# Patient Record
Sex: Female | Born: 1975 | Race: White | Hispanic: No | Marital: Married | Smoking: Never smoker
Health system: Southern US, Community
[De-identification: ages and names within clinical notes are randomized; demographics above are authoritative.]

## PROBLEM LIST (undated history)

## (undated) ENCOUNTER — Inpatient Hospital Stay (HOSPITAL_COMMUNITY): Payer: Self-pay

## (undated) DIAGNOSIS — Z789 Other specified health status: Secondary | ICD-10-CM

## (undated) HISTORY — PX: WISDOM TOOTH EXTRACTION: SHX21

## (undated) HISTORY — PX: MYRINGOPLASTY: SUR873

---

## 1998-07-02 ENCOUNTER — Other Ambulatory Visit: Admission: RE | Admit: 1998-07-02 | Discharge: 1998-07-02 | Payer: Self-pay | Admitting: *Deleted

## 1999-07-02 ENCOUNTER — Other Ambulatory Visit: Admission: RE | Admit: 1999-07-02 | Discharge: 1999-07-02 | Payer: Self-pay | Admitting: Obstetrics and Gynecology

## 2003-12-20 ENCOUNTER — Other Ambulatory Visit: Admission: RE | Admit: 2003-12-20 | Discharge: 2003-12-20 | Payer: Self-pay | Admitting: Obstetrics and Gynecology

## 2005-02-02 ENCOUNTER — Other Ambulatory Visit: Admission: RE | Admit: 2005-02-02 | Discharge: 2005-02-02 | Payer: Self-pay | Admitting: Obstetrics and Gynecology

## 2006-03-02 ENCOUNTER — Other Ambulatory Visit: Admission: RE | Admit: 2006-03-02 | Discharge: 2006-03-02 | Payer: Self-pay | Admitting: Obstetrics & Gynecology

## 2007-03-31 ENCOUNTER — Other Ambulatory Visit: Admission: RE | Admit: 2007-03-31 | Discharge: 2007-03-31 | Payer: Self-pay | Admitting: Obstetrics & Gynecology

## 2014-05-02 ENCOUNTER — Other Ambulatory Visit (HOSPITAL_COMMUNITY): Payer: Self-pay | Admitting: Obstetrics and Gynecology

## 2014-05-02 DIAGNOSIS — Z3141 Encounter for fertility testing: Secondary | ICD-10-CM

## 2014-05-09 ENCOUNTER — Ambulatory Visit (HOSPITAL_COMMUNITY)
Admission: RE | Admit: 2014-05-09 | Discharge: 2014-05-09 | Disposition: A | Discharge status: Home / Self Care | Payer: BC Managed Care – PPO | Source: Ambulatory Visit | Attending: Obstetrics and Gynecology | Admitting: Obstetrics and Gynecology

## 2014-05-09 DIAGNOSIS — Z3141 Encounter for fertility testing: Secondary | ICD-10-CM | POA: Insufficient documentation

## 2014-05-09 MED ORDER — IOHEXOL 300 MG/ML  SOLN
20.0000 mL | Freq: Once | INTRAMUSCULAR | Status: AC | PRN
  Administered 2014-05-09: 20 mL

## 2014-08-30 LAB — OB RESULTS CONSOLE HEPATITIS B SURFACE ANTIGEN: Hepatitis B Surface Ag: NEGATIVE

## 2014-08-30 LAB — OB RESULTS CONSOLE RUBELLA ANTIBODY, IGM: RUBELLA: IMMUNE

## 2014-08-30 LAB — OB RESULTS CONSOLE HIV ANTIBODY (ROUTINE TESTING): HIV: NONREACTIVE

## 2014-08-30 LAB — OB RESULTS CONSOLE GC/CHLAMYDIA
Chlamydia: NEGATIVE
Gonorrhea: NEGATIVE

## 2014-08-30 LAB — OB RESULTS CONSOLE RPR: RPR: NONREACTIVE

## 2015-01-14 ENCOUNTER — Encounter (HOSPITAL_COMMUNITY): Payer: Self-pay

## 2015-01-14 ENCOUNTER — Inpatient Hospital Stay (HOSPITAL_COMMUNITY)
Admission: AD | Admit: 2015-01-14 | Discharge: 2015-01-14 | Disposition: A | Discharge status: Home / Self Care | Payer: BLUE CROSS/BLUE SHIELD | Source: Ambulatory Visit | Attending: Obstetrics and Gynecology | Admitting: Obstetrics and Gynecology

## 2015-01-14 DIAGNOSIS — Z3A28 28 weeks gestation of pregnancy: Secondary | ICD-10-CM | POA: Insufficient documentation

## 2015-01-14 DIAGNOSIS — O30003 Twin pregnancy, unspecified number of placenta and unspecified number of amniotic sacs, third trimester: Secondary | ICD-10-CM | POA: Insufficient documentation

## 2015-01-14 DIAGNOSIS — O9989 Other specified diseases and conditions complicating pregnancy, childbirth and the puerperium: Secondary | ICD-10-CM | POA: Diagnosis present

## 2015-01-14 DIAGNOSIS — O26873 Cervical shortening, third trimester: Secondary | ICD-10-CM | POA: Insufficient documentation

## 2015-01-14 HISTORY — DX: Other specified health status: Z78.9

## 2015-01-14 NOTE — Discharge Instructions (Signed)
Pelvic Rest Pelvic rest is sometimes recommended for women when:   The placenta is partially or completely covering the opening of the cervix (placenta previa).  There is bleeding between the uterine wall and the amniotic sac in the first trimester (subchorionic hemorrhage).  The cervix begins to open without labor starting (incompetent cervix, cervical insufficiency).  The labor is too early (preterm labor). HOME CARE INSTRUCTIONS  Do not have sexual intercourse, stimulation, or an orgasm.  Do not use tampons, douche, or put anything in the vagina.  Do not lift anything over 10 pounds (4.5 kg).  Avoid strenuous activity or straining your pelvic muscles. SEEK MEDICAL CARE IF:  You have any vaginal bleeding during pregnancy. Treat this as a potential emergency.  You have cramping pain felt low in the stomach (stronger than menstrual cramps).  You notice vaginal discharge (watery, mucus, or bloody).  You have a low, dull backache.  There are regular contractions or uterine tightening. SEEK IMMEDIATE MEDICAL CARE IF: You have vaginal bleeding and have placenta previa.  Document Released: 03/27/2011 Document Revised: 02/22/2012 Document Reviewed: 03/27/2011 [facility] Patient Information 2015 [place], [place]. This information is not intended to replace advice given to you by your health care provider. Make sure you discuss any questions you have with your health care provider. Preterm Birth Preterm birth is a birth that happens before 37 weeks of pregnancy. Most pregnancies last about 39-41 weeks. Every week in the womb is important and is beneficial to the health of the infant. Infants born before 37 weeks of pregnancy are at a higher risk for complications. Depending on when the infant was born, he or she may be:  Late preterm. Born between 32 weeks and 37 weeks of pregnancy.  Very preterm. Born at less than 32 weeks of pregnancy.  Extremely preterm. Born at less than 25 weeks  of pregnancy. The earlier a baby is born, the more likely the child will have issues related to prematurity. Complications and problems that can be seen in infants born too early include:  Problems breathing (respiratory distress syndrome).  Low birth weight.  Problems feeding.  Sleeping problems.  Yellowing of the skin (jaundice).  Infections such as pneumonia. Babies born very preterm or extremely preterm are at risk for more serious medical issues. These include:  More severe breathing issues.  Eyesight issues.  Brain development issues (intraventricular hemorrhage).  Behavioral and emotional development issues.  Growth and developmental delays.  Cerebral palsy.  Serious feeding or bowel complications (necrotizing enterocolitis). CAUSES  There are two broad categories of preterm birth.  Spontaneous preterm birth. This is a birth resulting from preterm labor (not medically induced) or preterm premature rupture of membranes (PPROM).  Indicated preterm birth. This is a birth resulting from labor being medically induced due to health, personal, or social reasons. RISK FACTORS Preterm birth may be related to certain medical conditions, lifestyle factors, or demographic factors encountered by the mother or fetus.  Medical conditions include:  Multiple gestations (twins, triplets, and so on).  Infection.  Diabetes.  Heart disease.  Kidney disease.  Cervical or uterine abnormalities.  Being underweight.  High blood pressure or preeclampsia.  Premature rupture of membranes (PROM).  Birth defects in the fetus.  Lifestyle factors include:  Poor prenatal care.  Poor nutrition or anemia.  Cigarette smoking.  Consuming alcohol.  High levels of stress and lack of social or emotional support.  Exposure to chemical or environmental toxins.  Substance abuse.  Demographic factors include:  African-American  ethnicity.  Age (younger than 8618 or older than  37 years of age).  Low socioeconomic status. Women with a history of preterm labor or who become pregnant within 8018 months of giving birth are also at increased risk for preterm birth. DIAGNOSIS  Your health care provider may request additional tests to diagnose underlying complications resulting from preterm birth. Tests on the infant may include:  Physical exam.  Blood tests.  Chest X-rays.  Heart-lung monitoring. TREATMENT  After birth, special care will be taken to assess any problems or complications for the infant. Supportive care will be provided for the infant. Treatment depends on what problems are present and any complications that develop. Some preterm infants are cared for in a neonatal intensive care unit. In general, care may include:  Maintaining temperature and oxygen in a clear heated box (baby isolette).  Monitoring the infant's heart rate, breathing, and level of oxygen in the blood.  Monitoring for signs of infection and, if needed, giving IV antibiotic medicine.  Inserting a feeding tube (nose, mouth) or giving IV nutrition if unable to feed.  Inserting a breathing tube (ventilation).  Respiration support (continuous positive airway pressure [CPAP] or oxygen). Treatment will change as the infant builds up strength and is able to breathe and eat on his or her own. For some infants, no special treatment is necessary. Parents may be educated on the potential health risks of prematurity to the infant. HOME CARE INSTRUCTIONS  Understand your infant's special conditions and needs. It may be reassuring to learn about infant CPR.  Monitor your infant in the car seat until he or she grows and matures. Infant car seats can cause breathing difficulties for preterm infants.  Keep your infant warm. Dress your infant in layers and keep him or her away from drafts, especially in cold months of the year.  Wash your hands thoroughly after going to the bathroom or changing a  diaper. Late preterm infants may be more prone to infection.  Follow all your health care provider's instructions for providing support and care to your preterm infant.  Get support from organizations and groups that understand your challenges.  Follow up with your infant's health care provider as directed. Prevention There are some things you can do to help lower your risk of having a preterm infant in the future. These include:  Good prenatal care throughout the entire pregnancy. See a health care provider regularly for advice and tests.  Management of underlying medical conditions.  Proper self-care and lifestyle changes.  Proper diet and weight control.  Watching for signs of various infections. SEEK MEDICAL CARE IF:  Your infant has feeding difficulties.  Your infant has sleeping difficulties.  Your infant has breathing difficulties.  Your infant's skin starts to look yellow.  Your infant shows signs of infection, such as a stuffy nose, fever, crying, or bluish color of the skin. FOR MORE INFORMATION March of [employer]: www.marchofdimes.com Prematurity.org: www.prematurity.org Document Released: 02/20/2004 Document Revised: 09/20/2013 Document Reviewed: 06/29/2013 [facility] Patient Information 2015 [place]. This information is not intended to replace advice given to you by your health care provider. Make sure you discuss any questions you have with your health care provider.

## 2015-01-14 NOTE — MAU Note (Signed)
Pt presents for extended monitoring for a shortened cervix. Twin pregnancy. Denies vaginal bleeding or discharge. Denies pain.

## 2015-01-14 NOTE — MAU Note (Signed)
Urine in lab 

## 2015-01-14 NOTE — MAU Provider Note (Signed)
Chief Complaint: Prolonged Monitoring    Initial patient contact at 1200  SUBJECTIVE:   HPI: Patricia Cooley is a 39 y.o. G1P0 3282w0d  sent to MAU for prolonged monitoring following office visit where cervical length was found to be slightly short at 2.3- 2.4 with funneling. Had SVE also by Dr. Henderson Cloudomblin and cx was closed.  Pertinent ROS: Denies abdominal discomfort. No irritaitive vaginal discharge. Denies dysuria, urgency, frequency of urination. Denies contractions, vaginal bleeding or leakage of fluid. Good fetal movement.   Pregnancy Course: Twin gestation  Obstetrical Hx: G1  Problem list, past medical history, Ob/Gyn history, surgical history, family history and social history reviewed and updated as appropriate. Pertinent PMH: Noncontributory Pertinent PSH: None Pertinent Social Hx: Nonsmoker  Prescriptions prior to admission  Medication Sig Dispense Refill Last Dose  . cetirizine (ZYRTEC) 10 MG tablet Take 10 mg by mouth daily.   01/14/2015 at Unknown time  . omeprazole (PRILOSEC) 10 MG capsule Take 10 mg by mouth daily.   01/14/2015 at Unknown time  . Prenatal Vit-Fe Fumarate-FA (PRENATAL MULTIVITAMIN) TABS tablet Take 1 tablet by mouth daily at 12 noon.   01/14/2015 at Unknown time  . Triamcinolone Acetonide (NASACORT ALLERGY 24HR NA) Place 1 spray into the nose daily as needed (congestion).   Past Week at Unknown time    No Known Allergies  OBJECTIVE:  Blood pressure 130/72, pulse 102, temperature 98 F (36.7 C), temperature source Oral, resp. rate 18, height 5\' 7"  (1.702 m), weight 118.117 kg (260 lb 6.4 oz), last menstrual period 05/03/2014.  Physical Exam: General: WN, WD female in NAD HEENT: Normocephalic Cor: RRR without murmur Resp: CTA bilaterally Abd: Soft, NT, size appropriate for GA twins Back: Negative CVAT Ext: No edema  EFM x 1 1/2 hrs FHR: Baseline A; 145, B: 150 Both with moderate variability, accelerations present, no decelerations Toco: no  contractions    ASSESSMENT:  G1P0 7482w0d 1. Cervical shortening affecting pregnancy in third trimester   2. Twin gestation in third trimester   Fetal heart rate reassuring for gestational age, both twins Without evident preterm contractions  PLAN:  C/W Dr. Rana SnareLowe> office F/U in 2 days for fFN and consider repeat CL Discharge with PTL precautions Pelvic rest until return visit See AVS for patient education   Medication List    TAKE these medications        cetirizine 10 MG tablet  Commonly known as:  ZYRTEC  Take 10 mg by mouth daily.     NASACORT ALLERGY 24HR NA  Place 1 spray into the nose daily as needed (congestion).     omeprazole 10 MG capsule  Commonly known as:  PRILOSEC  Take 10 mg by mouth daily.     prenatal multivitamin Tabs tablet  Take 1 tablet by mouth daily at 12 noon.       Follow-up Information    [facility]. Schedule an appointment as soon as possible for a visit in 2 days.   Specialty:  Obstetrics and Gynecology   Contact information:   [address] [place] [address] [phone]        Danae Orleanseirdre C Maigen Mozingo, CNM

## 2015-01-29 ENCOUNTER — Inpatient Hospital Stay (HOSPITAL_COMMUNITY)
Admission: AD | Admit: 2015-01-29 | Discharge: 2015-01-30 | DRG: 782 | Disposition: A | Discharge status: Home / Self Care | Payer: BLUE CROSS/BLUE SHIELD | Source: Ambulatory Visit | Attending: Obstetrics and Gynecology | Admitting: Obstetrics and Gynecology

## 2015-01-29 ENCOUNTER — Encounter (HOSPITAL_COMMUNITY): Payer: Self-pay | Admitting: General Practice

## 2015-01-29 DIAGNOSIS — O30043 Twin pregnancy, dichorionic/diamniotic, third trimester: Secondary | ICD-10-CM

## 2015-01-29 DIAGNOSIS — Z3A30 30 weeks gestation of pregnancy: Secondary | ICD-10-CM | POA: Diagnosis present

## 2015-01-29 DIAGNOSIS — O26873 Cervical shortening, third trimester: Principal | ICD-10-CM | POA: Insufficient documentation

## 2015-01-29 DIAGNOSIS — O4700 False labor before 37 completed weeks of gestation, unspecified trimester: Secondary | ICD-10-CM | POA: Insufficient documentation

## 2015-01-29 LAB — CBC
HCT: 34.5 % — ABNORMAL LOW (ref 36.0–46.0)
HEMOGLOBIN: 11.6 g/dL — AB (ref 12.0–15.0)
MCH: 30.4 pg (ref 26.0–34.0)
MCHC: 33.6 g/dL (ref 30.0–36.0)
MCV: 90.6 fL (ref 78.0–100.0)
Platelets: 269 10*3/uL (ref 150–400)
RBC: 3.81 MIL/uL — AB (ref 3.87–5.11)
RDW: 13.6 % (ref 11.5–15.5)
WBC: 13.7 10*3/uL — AB (ref 4.0–10.5)

## 2015-01-29 LAB — ABO/RH: ABO/RH(D): A POS

## 2015-01-29 LAB — TYPE AND SCREEN
ABO/RH(D): A POS
Antibody Screen: NEGATIVE

## 2015-01-29 MED ORDER — ZOLPIDEM TARTRATE 5 MG PO TABS
5.0000 mg | ORAL_TABLET | Freq: Every evening | ORAL | Status: DC | PRN
Start: 2015-01-29 — End: 2015-01-29

## 2015-01-29 MED ORDER — ACETAMINOPHEN 325 MG PO TABS: 650.0000 mg | ORAL_TABLET | ORAL | Status: DC | PRN

## 2015-01-29 MED ORDER — NIFEDIPINE 10 MG PO CAPS
10.0000 mg | ORAL_CAPSULE | ORAL | Status: DC
  Administered 2015-01-29 – 2015-01-30 (×5): 10 mg via ORAL
  Filled 2015-01-29 (×5): qty 1

## 2015-01-29 MED ORDER — DOCUSATE SODIUM 100 MG PO CAPS
100.0000 mg | ORAL_CAPSULE | Freq: Every day | ORAL | Status: DC
  Filled 2015-01-29 (×3): qty 1

## 2015-01-29 MED ORDER — LACTATED RINGERS IV SOLN
INTRAVENOUS | Status: DC
  Administered 2015-01-29: 125 mL/h via INTRAVENOUS
  Administered 2015-01-29: 15:00:00 via INTRAVENOUS

## 2015-01-29 MED ORDER — NIFEDIPINE 10 MG PO CAPS
20.0000 mg | ORAL_CAPSULE | Freq: Once | ORAL | Status: AC
  Administered 2015-01-29: 20 mg via ORAL
  Filled 2015-01-29: qty 2

## 2015-01-29 MED ORDER — CALCIUM CARBONATE ANTACID 500 MG PO CHEW
2.0000 | CHEWABLE_TABLET | ORAL | Status: DC | PRN
  Filled 2015-01-29: qty 2

## 2015-01-29 MED ORDER — BETAMETHASONE SOD PHOS & ACET 6 (3-3) MG/ML IJ SUSP
12.0000 mg | INTRAMUSCULAR | Status: AC
  Administered 2015-01-29 – 2015-01-30 (×2): 12 mg via INTRAMUSCULAR
  Filled 2015-01-29 (×2): qty 2

## 2015-01-29 MED ORDER — PRENATAL MULTIVITAMIN CH
1.0000 | ORAL_TABLET | Freq: Every day | ORAL | Status: DC
  Administered 2015-01-30: 1 via ORAL
  Filled 2015-01-29 (×3): qty 1

## 2015-01-29 MED ORDER — ZOLPIDEM TARTRATE 5 MG PO TABS: 5.0000 mg | ORAL_TABLET | Freq: Every evening | ORAL | Status: DC | PRN

## 2015-01-29 NOTE — MAU Provider Note (Signed)
Chief Complaint:  rule out ptl    First Provider Initiated Contact with Patient 01/29/15 1243      HPI: Patricia Cooley is a 39 y.o. G1P0 at 7230w1dwho presents to maternity admissions sent from the office for positive FFN and shortened cervix.  She was diagnosed a few weeks ago with shortened cervix at 2.4 cm but was seen in the office yesterday and had cervix of  1.5 with funneling.  Today, she received a call from the office that her FFN was positive and was sent to MAU for further evaluation.  She denies feeling abdominal cramping/contractions.  She reports good fetal movement, denies LOF, vaginal bleeding, vaginal itching/burning, urinary symptoms, h/a, dizziness, n/v, or fever/chills.     Past Medical History: Past Medical History  Diagnosis Date  . Medical history non-contributory     Past obstetric history: OB History  Gravida Para Term Preterm AB SAB TAB Ectopic Multiple Living  1             # Outcome Date GA Lbr Len/2nd Weight Sex Delivery Anes PTL Lv  1 Current               Past Surgical History: Past Surgical History  Procedure Laterality Date  . No past surgeries      Family History: History reviewed. No pertinent family history.  Social History: History  Substance Use Topics  . Smoking status: Never Smoker   . Smokeless tobacco: Never Used  . Alcohol Use: No    Allergies: No Known Allergies  Meds:  Prescriptions prior to admission  Medication Sig Dispense Refill Last Dose  . cetirizine (ZYRTEC) 10 MG tablet Take 10 mg by mouth daily.   01/29/2015 at Unknown time  . omeprazole (PRILOSEC) 10 MG capsule Take 10 mg by mouth daily.   01/29/2015 at Unknown time  . Prenatal Vit-Fe Fumarate-FA (PRENATAL MULTIVITAMIN) TABS tablet Take 1 tablet by mouth daily at 12 noon.   01/29/2015 at Unknown time  . Triamcinolone Acetonide (NASACORT ALLERGY 24HR NA) Place 1 spray into the nose daily as needed (congestion).   Past Month at Unknown time    ROS: Pertinent findings  in history of present illness.  Physical Exam  Blood pressure 136/80, pulse 94, temperature 99.3 F (37.4 C), temperature source Oral, resp. rate 18, weight 119.84 kg (264 lb 3.2 oz), last menstrual period 05/03/2014. GENERAL: Well-developed, well-nourished female in no acute distress.  HEENT: normocephalic HEART: normal rate RESP: normal effort ABDOMEN: Soft, non-tender, gravid appropriate for gestational age EXTREMITIES: Nontender, no edema NEURO: alert and oriented     FHT Baby A:  Baseline 155 , moderate variability, accelerations present, no decelerations FHT Baby B:  Baseline 155 , moderate variability, accelerations present, no decelerations Contractions: None on toco or to palpation    Assessment: 1. Threatened preterm labor, antepartum     Plan: Consult Dr Renaldo FiddlerAdkins Admit to antepartum BMZ x 2 doses in 24 hours Repeat U/S for cervical length prior to D/C     Medication List    ASK your doctor about these medications        cetirizine 10 MG tablet  Commonly known as:  ZYRTEC  Take 10 mg by mouth daily.     NASACORT ALLERGY 24HR NA  Place 1 spray into the nose daily as needed (congestion).     omeprazole 10 MG capsule  Commonly known as:  PRILOSEC  Take 10 mg by mouth daily.     prenatal multivitamin Tabs  tablet  Take 1 tablet by mouth daily at 12 noon.        Sharen Counter Certified Nurse-Midwife 01/29/2015 1:25 PM

## 2015-01-29 NOTE — MAU Note (Signed)
Urine in lab 

## 2015-01-29 NOTE — H&P (Signed)
Patricia Cooley is a 39 y.o. female G1P0 with twins @ 30+ weeks presenting for admission because of decreased cervical length and +FFN.  Pt was eval in office yesterday and noted to have cervical length of 1.5cm w/ small funneling.  Previous length was normal.  No vb or ctx.  No lof. + FM x 2  History OB History    Gravida Para Term Preterm AB TAB SAB Ectopic Multiple Living   1              Past Medical History  Diagnosis Date  . Medical history non-contributory    Past Surgical History  Procedure Laterality Date  . No past surgeries     Family History: family history is not on file. Social History:  reports that she has never smoked. She has never used smokeless tobacco. She reports that she does not drink alcohol or use illicit drugs.   Prenatal Transfer Tool  Maternal Diabetes: No Genetic Screening: Declined Maternal Ultrasounds/Referrals: Normal Fetal Ultrasounds or other Referrals:  None Maternal Substance Abuse:  No Significant Maternal Medications:  None Significant Maternal Lab Results:  None Other Comments:  None  ROS    Blood pressure 136/80, pulse 94, temperature 99.3 F (37.4 C), temperature source Oral, resp. rate 18, weight 264 lb 3.2 oz (119.84 kg), last menstrual period 05/03/2014. Exam Physical Exam  Gen - NAD Abd - gravid, NT Ext - NT, no edema Cvx closed on exam 01/28/15  Prenatal labs: ABO, Rh:   Antibody:   Rubella:   RPR:    HBsAg:    HIV:    GBS:     Assessment/Plan: Twins @ 30wks with shortened cervical length and + FFN - no current evidence of contractions Plan for admission, increased rest and BMZ Re-eval cervical length after BMZ   [place] 01/29/2015, 1:45 PM

## 2015-01-29 NOTE — Progress Notes (Signed)
Pt having contractions Q3-104min.  Not feeling pain.  No vb or lof.  + FM x 2 Given procardia 20mg  with good response this afternoon, but now starting back  FHT - cat 1 x 2 Toco mild Q2-5 Cvx - deferred  A/P:  Twins/PTL @ 30+1 wks S/p BMZ x 1 Will continue procardia Q4hrs

## 2015-01-29 NOTE — MAU Note (Signed)
Was called and sent in for monitoring.  +fFN yesterday.  Twin gest

## 2015-01-30 ENCOUNTER — Inpatient Hospital Stay (HOSPITAL_COMMUNITY): Payer: BLUE CROSS/BLUE SHIELD

## 2015-01-30 DIAGNOSIS — O26873 Cervical shortening, third trimester: Secondary | ICD-10-CM | POA: Insufficient documentation

## 2015-01-30 DIAGNOSIS — Z3A30 30 weeks gestation of pregnancy: Secondary | ICD-10-CM | POA: Insufficient documentation

## 2015-01-30 DIAGNOSIS — O4700 False labor before 37 completed weeks of gestation, unspecified trimester: Secondary | ICD-10-CM | POA: Insufficient documentation

## 2015-01-30 LAB — RPR: RPR Ser Ql: NONREACTIVE

## 2015-01-30 MED ORDER — PANTOPRAZOLE SODIUM 20 MG PO TBEC
20.0000 mg | DELAYED_RELEASE_TABLET | Freq: Every day | ORAL | Status: DC
  Filled 2015-01-30: qty 1

## 2015-01-30 MED ORDER — PANTOPRAZOLE SODIUM 40 MG PO TBEC: 40.0000 mg | DELAYED_RELEASE_TABLET | Freq: Every day | ORAL | Status: DC

## 2015-01-30 MED ORDER — NIFEDIPINE 10 MG PO CAPS: 10.0000 mg | ORAL_CAPSULE | Freq: Four times a day (QID) | ORAL | Status: DC

## 2015-01-30 MED ORDER — PANTOPRAZOLE SODIUM 40 MG PO TBEC
40.0000 mg | DELAYED_RELEASE_TABLET | Freq: Every day | ORAL | Status: DC
  Filled 2015-01-30: qty 1

## 2015-01-30 MED ORDER — LORATADINE 10 MG PO TABS
10.0000 mg | ORAL_TABLET | Freq: Every day | ORAL | Status: DC
  Administered 2015-01-30: 10 mg via ORAL
  Filled 2015-01-30: qty 1

## 2015-01-30 NOTE — Progress Notes (Signed)
After MFM u/s, CL is stable at 1.2 cm.  BMZ injection #2 given.  Patient continues to report no pain/pressure.  Will plan to be closely followed in office with next appt Friday for CL.  Strict bedrest precautions given.    Mitchel HonourMegan Matin Mattioli, DO

## 2015-01-30 NOTE — Progress Notes (Signed)
No complaints.  Not feeling CTX or vaginal pressure.  Wants to go home.    VSS. AF.  FHT Cat I x 2.   Toco-Occ CTX; improved with Procardia  Gen: A&O x 3 Abd: soft, gravid Ext: no c/c/e  38yo G1 at 3257w2d with shortened CL and twins -BMZ #2 at 2pm today -Continue procardia -Rpt CL this PM to consider discharge with close outpt f/u and bedrest  Mitchel HonourMegan Liliya Fullenwider, DO

## 2015-01-30 NOTE — Discharge Instructions (Signed)
Call MD for T>100.4, heavy vaginal bleeding, leakage of fluid, decreased fetal movement, contractions or respiratory distress.  Call office to schedule appointment and cervical length for Friday.  Strict bedrest and pelvic rest.  Fetal Movement Counts Patient Name: __________________________________________________ Patient Due Date: ____________________ Performing a fetal movement count is highly recommended in high-risk pregnancies, but it is good for every pregnant woman to do. Your health care provider may ask you to start counting fetal movements at 28 weeks of the pregnancy. Fetal movements often increase:  After eating a full meal.  After physical activity.  After eating or drinking something sweet or cold.  At rest. Pay attention to when you feel the baby is most active. This will help you notice a pattern of your baby's sleep and wake cycles and what factors contribute to an increase in fetal movement. It is important to perform a fetal movement count at the same time each day when your baby is normally most active.  HOW TO COUNT FETAL MOVEMENTS 1. Find a quiet and comfortable area to sit or lie down on your left side. Lying on your left side provides the best blood and oxygen circulation to your baby. 2. Write down the day and time on a sheet of paper or in a journal. 3. Start counting kicks, flutters, swishes, rolls, or jabs in a 2-hour period. You should feel at least 10 movements within 2 hours. 4. If you do not feel 10 movements in 2 hours, wait 2-3 hours and count again. Look for a change in the pattern or not enough counts in 2 hours. SEEK MEDICAL CARE IF:  You feel less than 10 counts in 2 hours, tried twice.  There is no movement in over an hour.  The pattern is changing or taking longer each day to reach 10 counts in 2 hours.  You feel the baby is not moving as he or she usually does. Date: ____________ Movements: ____________ Start time: ____________ Doreatha MartinFinish time:  ____________  Date: ____________ Movements: ____________ Start time: ____________ Doreatha MartinFinish time: ____________ Date: ____________ Movements: ____________ Start time: ____________ Doreatha MartinFinish time: ____________ Date: ____________ Movements: ____________ Start time: ____________ Doreatha MartinFinish time: ____________ Date: ____________ Movements: ____________ Start time: ____________ Doreatha MartinFinish time: ____________ Date: ____________ Movements: ____________ Start time: ____________ Doreatha MartinFinish time: ____________ Date: ____________ Movements: ____________ Start time: ____________ Doreatha MartinFinish time: ____________ Date: ____________ Movements: ____________ Start time: ____________ Doreatha MartinFinish time: ____________  Date: ____________ Movements: ____________ Start time: ____________ Doreatha MartinFinish time: ____________ Date: ____________ Movements: ____________ Start time: ____________ Doreatha MartinFinish time: ____________ Date: ____________ Movements: ____________ Start time: ____________ Doreatha MartinFinish time: ____________ Date: ____________ Movements: ____________ Start time: ____________ Doreatha MartinFinish time: ____________ Date: ____________ Movements: ____________ Start time: ____________ Doreatha MartinFinish time: ____________ Date: ____________ Movements: ____________ Start time: ____________ Doreatha MartinFinish time: ____________ Date: ____________ Movements: ____________ Start time: ____________ Doreatha MartinFinish time: ____________  Date: ____________ Movements: ____________ Start time: ____________ Doreatha MartinFinish time: ____________ Date: ____________ Movements: ____________ Start time: ____________ Doreatha MartinFinish time: ____________ Date: ____________ Movements: ____________ Start time: ____________ Doreatha MartinFinish time: ____________ Date: ____________ Movements: ____________ Start time: ____________ Doreatha MartinFinish time: ____________ Date: ____________ Movements: ____________ Start time: ____________ Doreatha MartinFinish time: ____________ Date: ____________ Movements: ____________ Start time: ____________ Doreatha MartinFinish time: ____________ Date: ____________ Movements:  ____________ Start time: ____________ Doreatha MartinFinish time: ____________  Date: ____________ Movements: ____________ Start time: ____________ Doreatha MartinFinish time: ____________ Date: ____________ Movements: ____________ Start time: ____________ Doreatha MartinFinish time: ____________ Date: ____________ Movements: ____________ Start time: ____________ Doreatha MartinFinish time: ____________ Date: ____________ Movements: ____________ Start time: ____________ Doreatha MartinFinish time: ____________ Date: ____________ Movements: ____________  Start time: ____________ Doreatha Martin time: ____________ Date: ____________ Movements: ____________ Start time: ____________ Doreatha Martin time: ____________ Date: ____________ Movements: ____________ Start time: ____________ Doreatha Martin time: ____________  Date: ____________ Movements: ____________ Start time: ____________ Doreatha Martin time: ____________ Date: ____________ Movements: ____________ Start time: ____________ Doreatha Martin time: ____________ Date: ____________ Movements: ____________ Start time: ____________ Doreatha Martin time: ____________ Date: ____________ Movements: ____________ Start time: ____________ Doreatha Martin time: ____________ Date: ____________ Movements: ____________ Start time: ____________ Doreatha Martin time: ____________ Date: ____________ Movements: ____________ Start time: ____________ Doreatha Martin time: ____________ Date: ____________ Movements: ____________ Start time: ____________ Doreatha Martin time: ____________  Date: ____________ Movements: ____________ Start time: ____________ Doreatha Martin time: ____________ Date: ____________ Movements: ____________ Start time: ____________ Doreatha Martin time: ____________ Date: ____________ Movements: ____________ Start time: ____________ Doreatha Martin time: ____________ Date: ____________ Movements: ____________ Start time: ____________ Doreatha Martin time: ____________ Date: ____________ Movements: ____________ Start time: ____________ Doreatha Martin time: ____________ Date: ____________ Movements: ____________ Start time: ____________ Doreatha Martin  time: ____________ Date: ____________ Movements: ____________ Start time: ____________ Doreatha Martin time: ____________  Date: ____________ Movements: ____________ Start time: ____________ Doreatha Martin time: ____________ Date: ____________ Movements: ____________ Start time: ____________ Doreatha Martin time: ____________ Date: ____________ Movements: ____________ Start time: ____________ Doreatha Martin time: ____________ Date: ____________ Movements: ____________ Start time: ____________ Doreatha Martin time: ____________ Date: ____________ Movements: ____________ Start time: ____________ Doreatha Martin time: ____________ Date: ____________ Movements: ____________ Start time: ____________ Doreatha Martin time: ____________ Date: ____________ Movements: ____________ Start time: ____________ Doreatha Martin time: ____________  Date: ____________ Movements: ____________ Start time: ____________ Doreatha Martin time: ____________ Date: ____________ Movements: ____________ Start time: ____________ Doreatha Martin time: ____________ Date: ____________ Movements: ____________ Start time: ____________ Doreatha Martin time: ____________ Date: ____________ Movements: ____________ Start time: ____________ Doreatha Martin time: ____________ Date: ____________ Movements: ____________ Start time: ____________ Doreatha Martin time: ____________ Date: ____________ Movements: ____________ Start time: ____________ Doreatha Martin time: ____________ Document Released: 12/30/2006 Document Revised: 04/16/2014 Document Reviewed: 09/26/2012 Exit[employer] Patient Information 2015 [employer]. This information is not intended to replace advice given to you by your health care provider. Make sure you discuss any questions you have with your health care provider.

## 2015-01-30 NOTE — Progress Notes (Signed)
Ur chart review completed.  

## 2015-01-30 NOTE — Discharge Summary (Signed)
Obstetric Discharge Summary Reason for Admission: shortened cervical length, positive FFN Prenatal Procedures: ultrasound Intrapartum Procedures: n/a Postpartum Procedures: n/a Complications-Operative and Postpartum: none HEMOGLOBIN  Date Value Ref Range Status  01/29/2015 11.6* 12.0 - 15.0 g/dL Final   HCT  Date Value Ref Range Status  01/29/2015 34.5* 36.0 - 46.0 % Final    Physical Exam:  General: alert, cooperative and appears stated age 15Lochia: n/a Uterine Fundus: soft Incision: n/a DVT Evaluation: No evidence of DVT seen on physical exam. Negative Homan's sign. No cords or calf tenderness.  Discharge Diagnoses: shortened cervical lenth, Di/Di twins  Discharge Information: Date: 01/30/2015 Activity: pelvic rest Diet: routine Medications: PNV and Procardia Condition: stable Instructions: refer to practice specific booklet Discharge to: home   Newborn Data: This patient has no babies on file. Home with mother.  Donneisha Beane 01/30/2015, 2:05 PM

## 2015-01-31 LAB — CULTURE, BETA STREP (GROUP B ONLY)

## 2015-02-01 ENCOUNTER — Inpatient Hospital Stay (HOSPITAL_COMMUNITY): Payer: BLUE CROSS/BLUE SHIELD

## 2015-02-01 ENCOUNTER — Encounter (HOSPITAL_COMMUNITY): Payer: Self-pay | Admitting: *Deleted

## 2015-02-01 ENCOUNTER — Inpatient Hospital Stay (HOSPITAL_COMMUNITY)
Admission: AD | Admit: 2015-02-01 | Discharge: 2015-02-01 | Disposition: A | Discharge status: Home / Self Care | Payer: BLUE CROSS/BLUE SHIELD | Source: Ambulatory Visit | Attending: Obstetrics and Gynecology | Admitting: Obstetrics and Gynecology

## 2015-02-01 DIAGNOSIS — Z3A31 31 weeks gestation of pregnancy: Secondary | ICD-10-CM

## 2015-02-01 DIAGNOSIS — O2603 Excessive weight gain in pregnancy, third trimester: Secondary | ICD-10-CM | POA: Diagnosis not present

## 2015-02-01 DIAGNOSIS — R0602 Shortness of breath: Secondary | ICD-10-CM | POA: Diagnosis present

## 2015-02-01 DIAGNOSIS — O99891 Other specified diseases and conditions complicating pregnancy: Secondary | ICD-10-CM

## 2015-02-01 DIAGNOSIS — O2600 Excessive weight gain in pregnancy, unspecified trimester: Secondary | ICD-10-CM | POA: Diagnosis not present

## 2015-02-01 DIAGNOSIS — Z3A30 30 weeks gestation of pregnancy: Secondary | ICD-10-CM | POA: Insufficient documentation

## 2015-02-01 DIAGNOSIS — O9989 Other specified diseases and conditions complicating pregnancy, childbirth and the puerperium: Secondary | ICD-10-CM

## 2015-02-01 LAB — CBC
HEMATOCRIT: 29 % — AB (ref 36.0–46.0)
Hemoglobin: 9.8 g/dL — ABNORMAL LOW (ref 12.0–15.0)
MCH: 30.8 pg (ref 26.0–34.0)
MCHC: 33.8 g/dL (ref 30.0–36.0)
MCV: 91.2 fL (ref 78.0–100.0)
PLATELETS: 231 10*3/uL (ref 150–400)
RBC: 3.18 MIL/uL — AB (ref 3.87–5.11)
RDW: 13.4 % (ref 11.5–15.5)
WBC: 12.4 10*3/uL — AB (ref 4.0–10.5)

## 2015-02-01 LAB — COMPREHENSIVE METABOLIC PANEL
ALK PHOS: 165 U/L — AB (ref 39–117)
ALT: 20 U/L (ref 0–35)
AST: 23 U/L (ref 0–37)
Albumin: 2.6 g/dL — ABNORMAL LOW (ref 3.5–5.2)
Anion gap: 4 — ABNORMAL LOW (ref 5–15)
BUN: 13 mg/dL (ref 6–23)
CHLORIDE: 108 mmol/L (ref 96–112)
CO2: 23 mmol/L (ref 19–32)
Calcium: 8.4 mg/dL (ref 8.4–10.5)
Creatinine, Ser: 0.85 mg/dL (ref 0.50–1.10)
GFR calc non Af Amer: 87 mL/min — ABNORMAL LOW (ref >90)
Glucose, Bld: 80 mg/dL (ref 70–99)
Potassium: 3.9 mmol/L (ref 3.5–5.1)
Sodium: 135 mmol/L (ref 135–145)
Total Bilirubin: 0.2 mg/dL — ABNORMAL LOW (ref 0.3–1.2)
Total Protein: 5.9 g/dL — ABNORMAL LOW (ref 6.0–8.3)

## 2015-02-01 NOTE — MAU Provider Note (Signed)
History     CSN: [id number]  Arrival date and time: 02/01/15 1052   First Provider Initiated Contact with Patient 02/01/15 1145      Chief Complaint  Patient presents with  . Shortness of Breath   HPI Patricia Cooley 39 y.o. G1P0  presents to MAU with shortness of breath and 10# weight gain since 2/15.  She was seen by Dr. Henderson Cloud in clinic this am and directed to report here for CXR.  She doesn't notice more swelling than usual, denies weakness, fever, LOF, vag bleeding, dysuria.  Endorses good fetal movement.   This is a twin pregnancy.  She was inpatient earlier this week for shortened cx.  Now on procardia.   OB History    Gravida Para Term Preterm AB TAB SAB Ectopic Multiple Living   1               Past Medical History  Diagnosis Date  . Medical history non-contributory     Past Surgical History  Procedure Laterality Date  . Wisdom tooth extraction    . Myringoplasty      History reviewed. No pertinent family history.  History  Substance Use Topics  . Smoking status: Never Smoker   . Smokeless tobacco: Never Used  . Alcohol Use: No    Allergies: No Known Allergies  Prescriptions prior to admission  Medication Sig Dispense Refill Last Dose  . cetirizine (ZYRTEC) 10 MG tablet Take 10 mg by mouth daily.   01/29/2015 at Unknown time  . NIFEdipine (PROCARDIA) 10 MG capsule Take 1 capsule (10 mg total) by mouth every 6 (six) hours. 120 capsule 0   . omeprazole (PRILOSEC) 10 MG capsule Take 10 mg by mouth daily.   01/29/2015 at Unknown time  . Prenatal Vit-Fe Fumarate-FA (PRENATAL MULTIVITAMIN) TABS tablet Take 1 tablet by mouth daily at 12 noon.   01/29/2015 at Unknown time  . Triamcinolone Acetonide (NASACORT ALLERGY 24HR NA) Place 1 spray into the nose daily as needed (congestion).   Past Month at Unknown time    ROS Pertinent ROS in HPI  Physical Exam   Blood pressure 109/66, pulse 65, temperature 98.1 F (36.7 C), temperature source Oral, resp. rate  20, weight 270 lb (122.471 kg), last menstrual period 05/03/2014, SpO2 98 %.  Physical Exam  Constitutional: She is oriented to person, place, and time. She appears well-developed and well-nourished.  HENT:  Head: Normocephalic and atraumatic.  Eyes: EOM are normal.  Neck: Normal range of motion.  Cardiovascular: Normal rate and regular rhythm.   Respiratory: Effort normal and breath sounds normal. No respiratory distress.  GI: Soft. Bowel sounds are normal. She exhibits no distension. There is no tenderness. There is no rebound and no guarding.  Musculoskeletal: Normal range of motion.  Neurological: She is alert and oriented to person, place, and time.  Skin: Skin is warm and dry.  Psychiatric: She has a normal mood and affect.   Results for orders placed or performed during the hospital encounter of 02/01/15 (from the past 24 hour(s))  CBC     Status: Abnormal   Collection Time: 02/01/15 11:30 AM  Result Value Ref Range   WBC 12.4 (H) 4.0 - 10.5 K/uL   RBC 3.18 (L) 3.87 - 5.11 MIL/uL   Hemoglobin 9.8 (L) 12.0 - 15.0 g/dL   HCT 46.9 (L) 62.9 - 52.8 %   MCV 91.2 78.0 - 100.0 fL   MCH 30.8 26.0 - 34.0 pg   MCHC 33.8  30.0 - 36.0 g/dL   RDW 86.513.4 78.411.5 - 69.615.5 %   Platelets 231 150 - 400 K/uL  Comprehensive metabolic panel     Status: Abnormal   Collection Time: 02/01/15 11:30 AM  Result Value Ref Range   Sodium 135 135 - 145 mmol/L   Potassium 3.9 3.5 - 5.1 mmol/L   Chloride 108 96 - 112 mmol/L   CO2 23 19 - 32 mmol/L   Glucose, Bld 80 70 - 99 mg/dL   BUN 13 6 - 23 mg/dL   Creatinine, Ser 2.950.85 0.50 - 1.10 mg/dL   Calcium 8.4 8.4 - 28.410.5 mg/dL   Total Protein 5.9 (L) 6.0 - 8.3 g/dL   Albumin 2.6 (L) 3.5 - 5.2 g/dL   AST 23 0 - 37 U/L   ALT 20 0 - 35 U/L   Alkaline Phosphatase 165 (H) 39 - 117 U/L   Total Bilirubin 0.2 (L) 0.3 - 1.2 mg/dL   GFR calc non Af Amer 87 (L) >90 mL/min   GFR calc Af Amer >90 >90 mL/min   Anion gap 4 (L) 5 - 15   Dg Chest 2 View  02/01/2015    CLINICAL DATA:  Shortness of breath.  Thirty weeks pregnant.  EXAM: CHEST  2 VIEW  COMPARISON:  None.  FINDINGS: The heart size and mediastinal contours are within normal limits. Both lungs are clear. No evidence of pleural effusion. No mass or lymphadenopathy identified. The visualized skeletal structures are unremarkable.  IMPRESSION: No active cardiopulmonary disease.   Electronically Signed   By: Myles RosenthalJohn  Stahl M.D.   On: 02/01/2015 11:32    MAU Course  Procedures  MDM Fetal tracings are reactive for both babies and no contractions detected.  Discussed with Dr. Henderson Cloudomblin.  Reviewed CBC, CXR, NST.  O2 sats remain at 99% throughout.  He is agreeable to discharge if CMP results are okay.   F/u in clinic next week  Assessment and Plan  A:  1. Abnormal weight gain during pregnancy, antepartum   2. Shortness of breath during pregnancy     PLAN:  Discharge to home Continue bedrest PTL precautions F/U in clinic as scheduled/prn Patient may return to MAU as needed or if her condition were to change or worsen   Bertram Denvereague Clark, Trichelle Lehan E 02/01/2015, 11:45 AM

## 2015-02-01 NOTE — Discharge Instructions (Signed)

## 2015-02-01 NOTE — MAU Note (Signed)
Sent from office,10# gain this week.  Gets winded quickly/easily.; hard to take a deep breath.

## 2015-02-12 ENCOUNTER — Ambulatory Visit (INDEPENDENT_AMBULATORY_CARE_PROVIDER_SITE_OTHER): Payer: BLUE CROSS/BLUE SHIELD | Admitting: *Deleted

## 2015-02-12 VITALS — BP 118/78 | HR 100

## 2015-02-12 DIAGNOSIS — O30043 Twin pregnancy, dichorionic/diamniotic, third trimester: Secondary | ICD-10-CM | POA: Diagnosis not present

## 2015-02-12 NOTE — Progress Notes (Signed)
Copy of report and NST tracing sent to Dr. Morris w/pt today.  

## 2015-02-19 ENCOUNTER — Ambulatory Visit (INDEPENDENT_AMBULATORY_CARE_PROVIDER_SITE_OTHER): Payer: BLUE CROSS/BLUE SHIELD | Admitting: *Deleted

## 2015-02-19 VITALS — BP 128/76 | HR 98

## 2015-02-19 DIAGNOSIS — O30043 Twin pregnancy, dichorionic/diamniotic, third trimester: Secondary | ICD-10-CM

## 2015-02-19 NOTE — Progress Notes (Signed)
Copy of report and NST tracing sent to Dr. Holland w/pt today.  

## 2015-02-26 ENCOUNTER — Ambulatory Visit (INDEPENDENT_AMBULATORY_CARE_PROVIDER_SITE_OTHER): Payer: BLUE CROSS/BLUE SHIELD | Admitting: *Deleted

## 2015-02-26 VITALS — BP 128/80 | HR 101

## 2015-02-26 DIAGNOSIS — O30043 Twin pregnancy, dichorionic/diamniotic, third trimester: Secondary | ICD-10-CM

## 2015-02-26 NOTE — Progress Notes (Signed)
Copy of report and NST tracing sent to Dr. Grewal w/pt today.  

## 2015-03-05 ENCOUNTER — Ambulatory Visit (INDEPENDENT_AMBULATORY_CARE_PROVIDER_SITE_OTHER): Payer: BLUE CROSS/BLUE SHIELD | Admitting: *Deleted

## 2015-03-05 VITALS — BP 127/77 | HR 84

## 2015-03-05 DIAGNOSIS — O30043 Twin pregnancy, dichorionic/diamniotic, third trimester: Secondary | ICD-10-CM

## 2015-03-05 NOTE — Progress Notes (Signed)
Copy of report and NST tracing sent to Dr. McComb w/pt today 

## 2015-03-12 ENCOUNTER — Ambulatory Visit (INDEPENDENT_AMBULATORY_CARE_PROVIDER_SITE_OTHER): Payer: BLUE CROSS/BLUE SHIELD | Admitting: *Deleted

## 2015-03-12 ENCOUNTER — Other Ambulatory Visit: Payer: BLUE CROSS/BLUE SHIELD

## 2015-03-12 VITALS — BP 134/76 | HR 81

## 2015-03-12 DIAGNOSIS — O30043 Twin pregnancy, dichorionic/diamniotic, third trimester: Secondary | ICD-10-CM | POA: Diagnosis not present

## 2015-03-12 NOTE — Progress Notes (Signed)
Copy of report and NST tracing sent to Dr. Adkins w/pt today 

## 2015-03-14 ENCOUNTER — Encounter (HOSPITAL_COMMUNITY): Payer: Self-pay | Admitting: *Deleted

## 2015-03-14 ENCOUNTER — Inpatient Hospital Stay (HOSPITAL_COMMUNITY)
Admission: AD | Admit: 2015-03-14 | Discharge: 2015-03-18 | DRG: 765 | Disposition: A | Discharge status: Home / Self Care | Payer: BLUE CROSS/BLUE SHIELD | Source: Ambulatory Visit | Attending: Obstetrics & Gynecology | Admitting: Obstetrics & Gynecology

## 2015-03-14 ENCOUNTER — Inpatient Hospital Stay (HOSPITAL_COMMUNITY): Payer: BLUE CROSS/BLUE SHIELD | Admitting: Anesthesiology

## 2015-03-14 DIAGNOSIS — IMO0001 Reserved for inherently not codable concepts without codable children: Secondary | ICD-10-CM

## 2015-03-14 DIAGNOSIS — O30043 Twin pregnancy, dichorionic/diamniotic, third trimester: Secondary | ICD-10-CM | POA: Diagnosis present

## 2015-03-14 DIAGNOSIS — O42913 Preterm premature rupture of membranes, unspecified as to length of time between rupture and onset of labor, third trimester: Secondary | ICD-10-CM | POA: Diagnosis present

## 2015-03-14 DIAGNOSIS — Z98891 History of uterine scar from previous surgery: Secondary | ICD-10-CM

## 2015-03-14 DIAGNOSIS — Z3A36 36 weeks gestation of pregnancy: Secondary | ICD-10-CM | POA: Diagnosis present

## 2015-03-14 DIAGNOSIS — O09513 Supervision of elderly primigravida, third trimester: Secondary | ICD-10-CM | POA: Diagnosis not present

## 2015-03-14 LAB — TYPE AND SCREEN
ABO/RH(D): A POS
ANTIBODY SCREEN: NEGATIVE

## 2015-03-14 LAB — CBC
HCT: 32.1 % — ABNORMAL LOW (ref 36.0–46.0)
Hemoglobin: 10.6 g/dL — ABNORMAL LOW (ref 12.0–15.0)
MCH: 29.3 pg (ref 26.0–34.0)
MCHC: 33 g/dL (ref 30.0–36.0)
MCV: 88.7 fL (ref 78.0–100.0)
PLATELETS: 218 10*3/uL (ref 150–400)
RBC: 3.62 MIL/uL — ABNORMAL LOW (ref 3.87–5.11)
RDW: 14.2 % (ref 11.5–15.5)
WBC: 9.7 10*3/uL (ref 4.0–10.5)

## 2015-03-14 LAB — OB RESULTS CONSOLE GBS: GBS: NEGATIVE

## 2015-03-14 LAB — POCT FERN TEST: POCT Fern Test: POSITIVE

## 2015-03-14 LAB — GROUP B STREP BY PCR: Group B strep by PCR: NEGATIVE

## 2015-03-14 MED ORDER — EPHEDRINE 5 MG/ML INJ: 10.0000 mg | INTRAVENOUS | Status: DC | PRN

## 2015-03-14 MED ORDER — OXYTOCIN BOLUS FROM INFUSION
500.0000 mL | INTRAVENOUS | Status: DC
Start: 2015-03-14 — End: 2015-03-15

## 2015-03-14 MED ORDER — LIDOCAINE HCL (PF) 1 % IJ SOLN: 30.0000 mL | INTRAMUSCULAR | Status: DC | PRN

## 2015-03-14 MED ORDER — TERBUTALINE SULFATE 1 MG/ML IJ SOLN: 0.2500 mg | Freq: Once | INTRAMUSCULAR | Status: AC | PRN

## 2015-03-14 MED ORDER — LIDOCAINE HCL (PF) 1 % IJ SOLN
INTRAMUSCULAR | Status: DC | PRN
  Administered 2015-03-14 (×2): 5 mL

## 2015-03-14 MED ORDER — PHENYLEPHRINE 40 MCG/ML (10ML) SYRINGE FOR IV PUSH (FOR BLOOD PRESSURE SUPPORT): 80.0000 ug | PREFILLED_SYRINGE | INTRAVENOUS | Status: DC | PRN

## 2015-03-14 MED ORDER — OXYTOCIN 40 UNITS IN LACTATED RINGERS INFUSION - SIMPLE MED
1.0000 m[IU]/min | INTRAVENOUS | Status: DC
  Administered 2015-03-14: 2 m[IU]/min via INTRAVENOUS
  Filled 2015-03-14: qty 1000

## 2015-03-14 MED ORDER — PENICILLIN G POTASSIUM 5000000 UNITS IJ SOLR
5.0000 10*6.[IU] | Freq: Once | INTRAMUSCULAR | Status: DC
  Administered 2015-03-14: 5 10*6.[IU] via INTRAVENOUS
  Filled 2015-03-14: qty 5

## 2015-03-14 MED ORDER — PENICILLIN G POTASSIUM 5000000 UNITS IJ SOLR
2.5000 10*6.[IU] | INTRAVENOUS | Status: DC
  Filled 2015-03-14 (×3): qty 2.5

## 2015-03-14 MED ORDER — LACTATED RINGERS IV SOLN: 500.0000 mL | Freq: Once | INTRAVENOUS | Status: DC

## 2015-03-14 MED ORDER — ONDANSETRON HCL 4 MG/2ML IJ SOLN: 4.0000 mg | Freq: Four times a day (QID) | INTRAMUSCULAR | Status: DC | PRN

## 2015-03-14 MED ORDER — FLEET ENEMA 7-19 GM/118ML RE ENEM: 1.0000 | ENEMA | RECTAL | Status: DC | PRN

## 2015-03-14 MED ORDER — DIPHENHYDRAMINE HCL 50 MG/ML IJ SOLN: 12.5000 mg | INTRAMUSCULAR | Status: DC | PRN

## 2015-03-14 MED ORDER — CITRIC ACID-SODIUM CITRATE 334-500 MG/5ML PO SOLN
30.0000 mL | ORAL | Status: DC | PRN
  Administered 2015-03-15: 30 mL via ORAL
  Filled 2015-03-14: qty 15

## 2015-03-14 MED ORDER — OXYCODONE-ACETAMINOPHEN 5-325 MG PO TABS: 2.0000 | ORAL_TABLET | ORAL | Status: DC | PRN

## 2015-03-14 MED ORDER — LACTATED RINGERS IV SOLN: 500.0000 mL | INTRAVENOUS | Status: DC | PRN

## 2015-03-14 MED ORDER — FENTANYL 2.5 MCG/ML BUPIVACAINE 1/10 % EPIDURAL INFUSION (WH - ANES)
14.0000 mL/h | INTRAMUSCULAR | Status: DC | PRN
  Administered 2015-03-14 – 2015-03-15 (×2): 14 mL/h via EPIDURAL
  Filled 2015-03-14 (×2): qty 125

## 2015-03-14 MED ORDER — LACTATED RINGERS IV SOLN
INTRAVENOUS | Status: DC
  Administered 2015-03-14 – 2015-03-15 (×4): via INTRAVENOUS

## 2015-03-14 MED ORDER — ACETAMINOPHEN 325 MG PO TABS: 650.0000 mg | ORAL_TABLET | ORAL | Status: DC | PRN

## 2015-03-14 MED ORDER — OXYTOCIN 40 UNITS IN LACTATED RINGERS INFUSION - SIMPLE MED: 62.5000 mL/h | INTRAVENOUS | Status: DC

## 2015-03-14 MED ORDER — PHENYLEPHRINE 40 MCG/ML (10ML) SYRINGE FOR IV PUSH (FOR BLOOD PRESSURE SUPPORT)
80.0000 ug | PREFILLED_SYRINGE | INTRAVENOUS | Status: DC | PRN
  Filled 2015-03-14: qty 20

## 2015-03-14 MED ORDER — ZOLPIDEM TARTRATE 5 MG PO TABS: 5.0000 mg | ORAL_TABLET | Freq: Every evening | ORAL | Status: DC | PRN

## 2015-03-14 MED ORDER — OXYCODONE-ACETAMINOPHEN 5-325 MG PO TABS: 1.0000 | ORAL_TABLET | ORAL | Status: DC | PRN

## 2015-03-14 NOTE — MAU Note (Signed)
Pt states her water broke around 1300.Clear fluid noted. Pt states she started having contractions after SROM

## 2015-03-14 NOTE — Anesthesia Procedure Notes (Signed)
Epidural Patient location during procedure: OB Start time: 03/14/2015 5:34 PM  Staffing Anesthesiologist: Brayton CavesJACKSON, Schylar Wuebker Performed by: anesthesiologist   Preanesthetic Checklist Completed: patient identified, site marked, surgical consent, pre-op evaluation, timeout performed, IV checked, risks and benefits discussed and monitors and equipment checked  Epidural Patient position: sitting Prep: site prepped and draped and DuraPrep Patient monitoring: continuous pulse ox and blood pressure Approach: midline Location: L3-L4 Injection technique: LOR air  Needle:  Needle type: Tuohy  Needle gauge: 17 G Needle length: 9 cm and 9 Needle insertion depth: 6 cm Catheter type: closed end flexible Catheter size: 19 Gauge Catheter at skin depth: 10 cm Test dose: negative  Assessment Events: blood not aspirated, injection not painful, no injection resistance, negative IV test and no paresthesia  Additional Notes Patient identified.  Risk benefits discussed including failed block, incomplete pain control, headache, nerve damage, paralysis, blood pressure changes, nausea, vomiting, reactions to medication both toxic or allergic, and postpartum back pain.  Patient expressed understanding and wished to proceed.  All questions were answered.  Sterile technique used throughout procedure and epidural site dressed with sterile barrier dressing. No paresthesia or other complications noted.The patient did not experience any signs of intravascular injection such as tinnitus or metallic taste in mouth nor signs of intrathecal spread such as rapid motor block. Please see nursing notes for vital signs.

## 2015-03-14 NOTE — Anesthesia Preprocedure Evaluation (Signed)
Anesthesia Evaluation  Patient identified by MRN, date of birth, ID band Patient awake    Reviewed: Allergy & Precautions, H&P , Patient's Chart, lab work & pertinent test results  Airway Mallampati: III  TM Distance: >3 FB Neck ROM: full    Dental   Pulmonary  breath sounds clear to auscultation        Cardiovascular Rhythm:regular Rate:Normal     Neuro/Psych    GI/Hepatic   Endo/Other  Morbid obesity  Renal/GU      Musculoskeletal   Abdominal   Peds  Hematology   Anesthesia Other Findings twins  Reproductive/Obstetrics (+) Pregnancy                             Anesthesia Physical Anesthesia Plan  ASA: III  Anesthesia Plan: Epidural   Post-op Pain Management:    Induction:   Airway Management Planned:   Additional Equipment:   Intra-op Plan:   Post-operative Plan:   Informed Consent: I have reviewed the patients History and Physical, chart, labs and discussed the procedure including the risks, benefits and alternatives for the proposed anesthesia with the patient or authorized representative who has indicated his/her understanding and acceptance.     Plan Discussed with:   Anesthesia Plan Comments:         Anesthesia Quick Evaluation

## 2015-03-14 NOTE — MAU Note (Signed)
Pt C/O pop & large gush of clear fluid @ 1300, has started having some back pain & lower abd tightening.  Pt states she saw a drop of blood on arrival to MAU in the bathroom.  Twin pregnancy, babies are vertex by U/S on Tuesday.  2 cm's.

## 2015-03-14 NOTE — H&P (Signed)
Patricia Cooley is a 39 y.o. female presenting for PPROM at 1300 today.  The patient reports painful CTX starting after ROM.  Di/Di twins with normal growth u/s 3/20, VTX/VTX. GBS negative.  AMA and patient declines testing.  Currently, comfortable with epidural.  Maternal Medical History:  Reason for admission: Rupture of membranes and contractions.   Contractions: Onset was 6-12 hours ago.   Frequency: regular.   Perceived severity is moderate.    Fetal activity: Perceived fetal activity is normal.   Last perceived fetal movement was within the past hour.    Prenatal complications: Preterm labor.   Prenatal Complications - Diabetes: none.    OB History    Gravida Para Term Preterm AB TAB SAB Ectopic Multiple Living   1              Past Medical History  Diagnosis Date  . Medical history non-contributory    Past Surgical History  Procedure Laterality Date  . Wisdom tooth extraction    . Myringoplasty     Family History: family history is not on file. Social History:  reports that she has never smoked. She has never used smokeless tobacco. She reports that she does not drink alcohol or use illicit drugs.   Prenatal Transfer Tool  Maternal Diabetes: No Genetic Screening: Declined Maternal Ultrasounds/Referrals: Normal Fetal Ultrasounds or other Referrals:  None Maternal Substance Abuse:  No Significant Maternal Medications:  None Significant Maternal Lab Results:  Lab values include: Group B Strep negative Other Comments:  None  ROS  Dilation: 6 Effacement (%): 100 Station: -1, 0 Exam by:: Cylas Falzone Height 5\' 7"  (1.702 m), weight 280 lb (127.007 kg), last menstrual period 05/03/2014. Maternal Exam:  Uterine Assessment: Contraction strength is moderate.  Contraction frequency is regular.   Abdomen: Patient reports no abdominal tenderness. Fundal height is S>E.   Estimated fetal weight is 6#, 6#.   Fetal presentation: vertex  Introitus: Normal vulva. Amniotic  fluid character: clear.  Pelvis: adequate for delivery.   Cervix: Cervix evaluated by digital exam.     Physical Exam  Constitutional: She is oriented to person, place, and time. She appears well-developed and well-nourished.  GI: Soft. There is no rebound and no guarding.  Neurological: She is alert and oriented to person, place, and time.  Skin: Skin is warm and dry.  Psychiatric: She has a normal mood and affect. Her behavior is normal.    Prenatal labs: ABO, Rh: --/--/A POS, A POS (02/16 1520) Antibody: NEG (02/16 1520) Rubella: Immune (09/17 0000) RPR: Non Reactive (02/16 1520)  HBsAg: Negative (09/17 0000)  HIV: Non-reactive (09/17 0000)  GBS:     Assessment/Plan: 38yo G1 at 7165w3d with labor, di/di twins -Anticipate NSVD with double set-up in OR -Add pitocin as needed  Patricia Cooley 03/14/2015, 5:46 PM

## 2015-03-15 ENCOUNTER — Encounter (HOSPITAL_COMMUNITY)
Admission: AD | Disposition: A | Discharge status: Home / Self Care | Payer: Self-pay | Source: Ambulatory Visit | Attending: Obstetrics & Gynecology

## 2015-03-15 ENCOUNTER — Encounter (HOSPITAL_COMMUNITY): Payer: Self-pay | Admitting: *Deleted

## 2015-03-15 DIAGNOSIS — Z98891 History of uterine scar from previous surgery: Secondary | ICD-10-CM

## 2015-03-15 LAB — RPR: RPR: NONREACTIVE

## 2015-03-15 SURGERY — Surgical Case
Anesthesia: Epidural | Site: Abdomen

## 2015-03-15 MED ORDER — OXYTOCIN 10 UNIT/ML IJ SOLN
INTRAMUSCULAR | Status: AC
  Filled 2015-03-15: qty 1

## 2015-03-15 MED ORDER — FENTANYL CITRATE 0.05 MG/ML IJ SOLN: 25.0000 ug | INTRAMUSCULAR | Status: DC | PRN

## 2015-03-15 MED ORDER — ONDANSETRON HCL 4 MG/2ML IJ SOLN
INTRAMUSCULAR | Status: AC
  Filled 2015-03-15: qty 2

## 2015-03-15 MED ORDER — SIMETHICONE 80 MG PO CHEW
80.0000 mg | CHEWABLE_TABLET | Freq: Three times a day (TID) | ORAL | Status: DC
  Administered 2015-03-15 – 2015-03-17 (×8): 80 mg via ORAL
  Filled 2015-03-15 (×9): qty 1

## 2015-03-15 MED ORDER — MENTHOL 3 MG MT LOZG: 1.0000 | LOZENGE | OROMUCOSAL | Status: DC | PRN

## 2015-03-15 MED ORDER — LANOLIN HYDROUS EX OINT: 1.0000 "application " | TOPICAL_OINTMENT | CUTANEOUS | Status: DC | PRN

## 2015-03-15 MED ORDER — WITCH HAZEL-GLYCERIN EX PADS: 1.0000 "application " | MEDICATED_PAD | CUTANEOUS | Status: DC | PRN

## 2015-03-15 MED ORDER — LIDOCAINE-EPINEPHRINE (PF) 2 %-1:200000 IJ SOLN
INTRAMUSCULAR | Status: AC
  Filled 2015-03-15: qty 20

## 2015-03-15 MED ORDER — SCOPOLAMINE 1 MG/3DAYS TD PT72
MEDICATED_PATCH | TRANSDERMAL | Status: DC | PRN
  Administered 2015-03-15: 1 via TRANSDERMAL

## 2015-03-15 MED ORDER — METHYLPREDNISOLONE SODIUM SUCC 125 MG IJ SOLR
INTRAMUSCULAR | Status: AC
  Filled 2015-03-15: qty 2

## 2015-03-15 MED ORDER — LIDOCAINE-EPINEPHRINE (PF) 2 %-1:200000 IJ SOLN
INTRAMUSCULAR | Status: DC | PRN
  Administered 2015-03-15: 3 mL via EPIDURAL
  Administered 2015-03-15 (×2): 5 mL via EPIDURAL
  Administered 2015-03-15: 7 mL via EPIDURAL

## 2015-03-15 MED ORDER — SIMETHICONE 80 MG PO CHEW: 80.0000 mg | CHEWABLE_TABLET | ORAL | Status: DC | PRN

## 2015-03-15 MED ORDER — DIBUCAINE 1 % RE OINT: 1.0000 "application " | TOPICAL_OINTMENT | RECTAL | Status: DC | PRN

## 2015-03-15 MED ORDER — SIMETHICONE 80 MG PO CHEW
80.0000 mg | CHEWABLE_TABLET | ORAL | Status: DC
  Administered 2015-03-15 – 2015-03-18 (×3): 80 mg via ORAL
  Filled 2015-03-15 (×3): qty 1

## 2015-03-15 MED ORDER — IBUPROFEN 600 MG PO TABS
600.0000 mg | ORAL_TABLET | Freq: Four times a day (QID) | ORAL | Status: DC | PRN
  Administered 2015-03-17: 600 mg via ORAL

## 2015-03-15 MED ORDER — SENNOSIDES-DOCUSATE SODIUM 8.6-50 MG PO TABS
2.0000 | ORAL_TABLET | ORAL | Status: DC
  Administered 2015-03-18: 2 via ORAL
  Filled 2015-03-15 (×3): qty 2

## 2015-03-15 MED ORDER — OXYCODONE-ACETAMINOPHEN 5-325 MG PO TABS: 1.0000 | ORAL_TABLET | ORAL | Status: DC | PRN

## 2015-03-15 MED ORDER — DIPHENHYDRAMINE HCL 50 MG/ML IJ SOLN: 12.5000 mg | INTRAMUSCULAR | Status: DC | PRN

## 2015-03-15 MED ORDER — TETANUS-DIPHTH-ACELL PERTUSSIS 5-2.5-18.5 LF-MCG/0.5 IM SUSP: 0.5000 mL | Freq: Once | INTRAMUSCULAR | Status: DC

## 2015-03-15 MED ORDER — OXYTOCIN 40 UNITS IN LACTATED RINGERS INFUSION - SIMPLE MED: 62.5000 mL/h | INTRAVENOUS | Status: AC

## 2015-03-15 MED ORDER — ACETAMINOPHEN 325 MG PO TABS: 650.0000 mg | ORAL_TABLET | ORAL | Status: DC | PRN

## 2015-03-15 MED ORDER — MIDAZOLAM HCL 2 MG/2ML IJ SOLN: 0.5000 mg | Freq: Once | INTRAMUSCULAR | Status: DC | PRN

## 2015-03-15 MED ORDER — LACTATED RINGERS IV SOLN
40.0000 [IU] | INTRAVENOUS | Status: DC | PRN
Start: 2015-03-15 — End: 2015-03-15
  Administered 2015-03-15: 40 [IU] via INTRAVENOUS

## 2015-03-15 MED ORDER — 0.9 % SODIUM CHLORIDE (POUR BTL) OPTIME
TOPICAL | Status: DC | PRN
  Administered 2015-03-15: 1000 mL

## 2015-03-15 MED ORDER — NALBUPHINE HCL 10 MG/ML IJ SOLN
5.0000 mg | INTRAMUSCULAR | Status: DC | PRN
  Administered 2015-03-15: 5 mg via INTRAVENOUS
  Filled 2015-03-15: qty 1

## 2015-03-15 MED ORDER — ACETAMINOPHEN 160 MG/5ML PO SOLN: 325.0000 mg | ORAL | Status: DC | PRN

## 2015-03-15 MED ORDER — ACETAMINOPHEN 500 MG PO TABS
1000.0000 mg | ORAL_TABLET | Freq: Four times a day (QID) | ORAL | Status: AC
  Administered 2015-03-15 – 2015-03-16 (×4): 1000 mg via ORAL
  Filled 2015-03-15 (×4): qty 2

## 2015-03-15 MED ORDER — DIPHENHYDRAMINE HCL 25 MG PO CAPS: 25.0000 mg | ORAL_CAPSULE | Freq: Four times a day (QID) | ORAL | Status: DC | PRN

## 2015-03-15 MED ORDER — IBUPROFEN 600 MG PO TABS
600.0000 mg | ORAL_TABLET | Freq: Four times a day (QID) | ORAL | Status: DC
  Administered 2015-03-15 – 2015-03-18 (×11): 600 mg via ORAL
  Filled 2015-03-15 (×12): qty 1

## 2015-03-15 MED ORDER — MORPHINE SULFATE 0.5 MG/ML IJ SOLN
INTRAMUSCULAR | Status: AC
  Filled 2015-03-15: qty 10

## 2015-03-15 MED ORDER — PROMETHAZINE HCL 25 MG/ML IJ SOLN: 6.2500 mg | INTRAMUSCULAR | Status: DC | PRN

## 2015-03-15 MED ORDER — MEPERIDINE HCL 25 MG/ML IJ SOLN: 6.2500 mg | INTRAMUSCULAR | Status: DC | PRN

## 2015-03-15 MED ORDER — KETOROLAC TROMETHAMINE 30 MG/ML IJ SOLN
30.0000 mg | Freq: Four times a day (QID) | INTRAMUSCULAR | Status: AC | PRN
Start: 2015-03-15 — End: 2015-03-16

## 2015-03-15 MED ORDER — NALBUPHINE HCL 10 MG/ML IJ SOLN: 5.0000 mg | INTRAMUSCULAR | Status: DC | PRN

## 2015-03-15 MED ORDER — LACTATED RINGERS IV SOLN
INTRAVENOUS | Status: DC | PRN
  Administered 2015-03-15: 07:00:00 via INTRAVENOUS

## 2015-03-15 MED ORDER — ONDANSETRON HCL 4 MG/2ML IJ SOLN
INTRAMUSCULAR | Status: DC | PRN
  Administered 2015-03-15: 4 mg via INTRAVENOUS

## 2015-03-15 MED ORDER — KETOROLAC TROMETHAMINE 30 MG/ML IJ SOLN: 30.0000 mg | Freq: Four times a day (QID) | INTRAMUSCULAR | Status: AC | PRN

## 2015-03-15 MED ORDER — DIPHENHYDRAMINE HCL 25 MG PO CAPS
25.0000 mg | ORAL_CAPSULE | ORAL | Status: DC | PRN
  Administered 2015-03-15: 25 mg via ORAL
  Filled 2015-03-15: qty 1

## 2015-03-15 MED ORDER — KETOROLAC TROMETHAMINE 30 MG/ML IJ SOLN: 30.0000 mg | Freq: Once | INTRAMUSCULAR | Status: DC | PRN

## 2015-03-15 MED ORDER — LACTATED RINGERS IV SOLN: INTRAVENOUS | Status: DC

## 2015-03-15 MED ORDER — NALOXONE HCL 0.4 MG/ML IJ SOLN: 0.4000 mg | INTRAMUSCULAR | Status: DC | PRN

## 2015-03-15 MED ORDER — NALBUPHINE HCL 10 MG/ML IJ SOLN: 5.0000 mg | Freq: Once | INTRAMUSCULAR | Status: AC | PRN

## 2015-03-15 MED ORDER — ACETAMINOPHEN 325 MG PO TABS: 325.0000 mg | ORAL_TABLET | ORAL | Status: DC | PRN

## 2015-03-15 MED ORDER — PRENATAL MULTIVITAMIN CH
1.0000 | ORAL_TABLET | Freq: Every day | ORAL | Status: DC
  Administered 2015-03-15 – 2015-03-18 (×4): 1 via ORAL
  Filled 2015-03-15 (×4): qty 1

## 2015-03-15 MED ORDER — ZOLPIDEM TARTRATE 5 MG PO TABS: 5.0000 mg | ORAL_TABLET | Freq: Every evening | ORAL | Status: DC | PRN

## 2015-03-15 MED ORDER — SODIUM CHLORIDE 0.9 % IJ SOLN: 3.0000 mL | INTRAMUSCULAR | Status: DC | PRN

## 2015-03-15 MED ORDER — DEXTROSE 5 % IV SOLN
INTRAVENOUS | Status: AC
  Filled 2015-03-15: qty 3000

## 2015-03-15 MED ORDER — OXYCODONE-ACETAMINOPHEN 5-325 MG PO TABS: 2.0000 | ORAL_TABLET | ORAL | Status: DC | PRN

## 2015-03-15 MED ORDER — DEXTROSE 5 % IV SOLN
3.0000 g | INTRAVENOUS | Status: DC | PRN
  Administered 2015-03-15: 3 g via INTRAVENOUS

## 2015-03-15 MED ORDER — SCOPOLAMINE 1 MG/3DAYS TD PT72
1.0000 | MEDICATED_PATCH | Freq: Once | TRANSDERMAL | Status: DC
  Filled 2015-03-15: qty 1

## 2015-03-15 MED ORDER — METHYLERGONOVINE MALEATE 0.2 MG/ML IJ SOLN
INTRAMUSCULAR | Status: DC | PRN
  Administered 2015-03-15: 0.2 mg via INTRAMUSCULAR

## 2015-03-15 MED ORDER — NALOXONE HCL 1 MG/ML IJ SOLN
1.0000 ug/kg/h | INTRAVENOUS | Status: DC | PRN
  Filled 2015-03-15: qty 2

## 2015-03-15 MED ORDER — MORPHINE SULFATE (PF) 0.5 MG/ML IJ SOLN
INTRAMUSCULAR | Status: DC | PRN
Start: 2015-03-15 — End: 2015-03-15
  Administered 2015-03-15: 4 mg via EPIDURAL

## 2015-03-15 MED ORDER — ONDANSETRON HCL 4 MG/2ML IJ SOLN: 4.0000 mg | Freq: Three times a day (TID) | INTRAMUSCULAR | Status: DC | PRN

## 2015-03-15 SURGICAL SUPPLY — 35 items
APL SKNCLS STERI-STRIP NONHPOA (GAUZE/BANDAGES/DRESSINGS) ×2
BENZOIN TINCTURE PRP APPL 2/3 (GAUZE/BANDAGES/DRESSINGS) ×3 IMPLANT
CLAMP CORD UMBIL (MISCELLANEOUS) ×9 IMPLANT
CLOSURE WOUND 1/2 X4 (GAUZE/BANDAGES/DRESSINGS) ×1
CLOTH BEACON ORANGE TIMEOUT ST (SAFETY) ×4 IMPLANT
DRAPE SHEET LG 3/4 BI-LAMINATE (DRAPES) ×6 IMPLANT
DRSG OPSITE POSTOP 4X10 (GAUZE/BANDAGES/DRESSINGS) ×4 IMPLANT
DURAPREP 26ML APPLICATOR (WOUND CARE) ×4 IMPLANT
ELECT REM PT RETURN 9FT ADLT (ELECTROSURGICAL) ×4
ELECTRODE REM PT RTRN 9FT ADLT (ELECTROSURGICAL) ×2 IMPLANT
EXTRACTOR VACUUM KIWI (MISCELLANEOUS) IMPLANT
EXTRACTOR VACUUM M CUP 4 TUBE (SUCTIONS) IMPLANT
EXTRACTOR VACUUM M CUP 4' TUBE (SUCTIONS)
GLOVE BIO SURGEON STRL SZ 6 (GLOVE) ×4 IMPLANT
GLOVE BIOGEL PI IND STRL 6 (GLOVE) ×4 IMPLANT
GLOVE BIOGEL PI INDICATOR 6 (GLOVE) ×4
GOWN STRL REUS W/TWL LRG LVL3 (GOWN DISPOSABLE) ×8 IMPLANT
KIT ABG SYR 3ML LUER SLIP (SYRINGE) ×4 IMPLANT
LIQUID BAND (GAUZE/BANDAGES/DRESSINGS) IMPLANT
NDL HYPO 25X5/8 SAFETYGLIDE (NEEDLE) ×1 IMPLANT
NEEDLE HYPO 25X5/8 SAFETYGLIDE (NEEDLE) ×4 IMPLANT
NS IRRIG 1000ML POUR BTL (IV SOLUTION) ×4 IMPLANT
PACK C SECTION WH (CUSTOM PROCEDURE TRAY) ×4 IMPLANT
PAD OB MATERNITY 4.3X12.25 (PERSONAL CARE ITEMS) ×4 IMPLANT
STAPLER VISISTAT 35W (STAPLE) IMPLANT
STRIP CLOSURE SKIN 1/2X4 (GAUZE/BANDAGES/DRESSINGS) ×2 IMPLANT
SUT CHROMIC 0 CTX 36 (SUTURE) ×12 IMPLANT
SUT MON AB 2-0 CT1 27 (SUTURE) ×4 IMPLANT
SUT PDS AB 0 CT1 27 (SUTURE) IMPLANT
SUT PLAIN 0 NONE (SUTURE) IMPLANT
SUT PLAIN 2 0 XLH (SUTURE) ×3 IMPLANT
SUT VIC AB 0 CT1 36 (SUTURE) IMPLANT
SUT VIC AB 4-0 KS 27 (SUTURE) ×3 IMPLANT
TOWEL OR 17X24 6PK STRL BLUE (TOWEL DISPOSABLE) ×4 IMPLANT
TRAY FOLEY CATH SILVER 14FR (SET/KITS/TRAYS/PACK) IMPLANT

## 2015-03-15 NOTE — Lactation Note (Signed)
This note was copied from the chart of Patricia Cooley. Lactation Consultation Note  Patient Name: Patricia Cooley Today's Date: 03/15/2015 Reason for consult: Initial assessment;Multiple gestation;Late preterm infant;Infant < 6lbs LC reviewed basic breastfeeding and special LPI needs information with parents.  LC encouraged q3h feedings, frequent STS, especially at feeding times, hand expression and pre-pumping with DEBP prior to latch and q3h post-pumping for 15 minutes until both babies are consistently latching and nursing well.  "A" twin became spitty after 5 minutes of latch time with intermittent sucks and a few swallows, then "B" latched for 5 minutes but needed intermittent stimulation.  Both babies achieved a deep latch with flanged lips but tired out quickly.  They were receiving their first bath and would be syringe-fed after mom uses DEBP.  Mom encouraged to feed baby 8-12 times/24 hours and with feeding cues. LC encouraged review of Baby and Me pp 9, 14 and 20-25 for STS and BF information. LC provided LC Resource brochure and reviewed WH services and list of community and web site resources.   Maternal Data Formula Feeding for Exclusion: No Has patient been taught Hand Expression?: Yes (RN and LC both demonstrated and assisted) Does the patient have breastfeeding experience prior to this delivery?: No  Feeding Feeding Type: Breast Fed Length of feed: 5 min  LATCH Score/Interventions Latch: Repeated attempts needed to sustain latch, nipple held in mouth throughout feeding, stimulation needed to elicit sucking reflex. Intervention(s): Adjust position;Assist with latch;Breast compression  Audible Swallowing: A few with stimulation Intervention(s): Skin to skin;Hand expression Intervention(s): Alternate breast massage  Type of Nipple: Everted at rest and after stimulation Intervention(s): Double electric pump  Comfort (Breast/Nipple): Soft / non-tender     Hold  (Positioning): Full assist, staff holds infant at breast Intervention(s): Breastfeeding basics reviewed;Support Pillows;Position options;Skin to skin (also reviewed LPI handout and guidelines)  LATCH Score: 6 (LC assisted and observed)  Lactation Tools Discussed/Used Breast pump type: Double-Electric Breast Pump Pump Review: Setup, frequency, and cleaning;Milk Storage Initiated by:: LC Date initiated:: 03/15/15 STS, cue feedings, hand expression LPI parent handout  Consult Status Consult Status: Follow-up Date: 03/16/15 Follow-up type: In-patient    Patricia Cooley 03/15/2015, 4:47 PM    

## 2015-03-15 NOTE — Progress Notes (Signed)
Mom requested cluster care in regards to having  Q4 hours assessment checks.  Mom stated she wanted her next check to be cluster with baby assessment and weight as witness by  Julianne HandlerAmanda Cameron CNA.

## 2015-03-15 NOTE — Consult Note (Signed)
Neonatology Note:   Attendance at C-section:    I was asked by Dr. Langston MaskerMorris to attend this primary C/S at 36 4/7 weeks due to FTP and twin gestation. The mother is a G1P0 A pos, GBS neg with di-di concordant twins. The pregnancy has been otherwise uncomplicated. ROM 18 hours prior to delivery, fluid clear. She was on Pen G > 4 hours prior to delivery and remained afebrile throughout labor.  Twin A, a girl, was presenting OP. Infant vigorous with good spontaneous cry and tone. Needed only minimal bulb suctioning. Ap 9/9. Lungs clear to ausc in DR. Allowed to remain with her mother for skin to skin time.  Twin B, a female, was delivered by vacuum extraction. He was vigorous at birth and had Ap 9/9. At 3 minutes of life, he began to have audible grunting and mild subcostal retractions. We performed chest PT and DeLee suctioned him once for removal of about 3-4 ml clear mucous. We placed a pulse oximeter and his O2 saturation was 98% in room air at 5 minutes. On auscultation, he had slight decreased air exchange. He was allowed to have 3-4 minutes of skin to skin time with his mother, then was taken to the CN to be observed closely while completing transition.  I spoke with both parents about the babies' condition. They are aware that Twin B will be monitored in CN until he is breathing normally, or could require transfer to NICU if his respiratory condition persists or worsens.   Doretha Souhristie C. Makensey Rego, MD

## 2015-03-15 NOTE — Lactation Note (Signed)
This note was copied from the chart of Patricia Cooley Natarajan. Lactation Consultation Note  Patient Name: Patricia Cooley Hurlbut WUJWJ'XToday's Date: 03/15/2015 Reason for consult: Follow-up assessment;Difficult latch;Infant < 6lbs;Late preterm infant;Multiple gestation Both babies are cuing and mom pre-pumped for 5 minutes with DEBP.  Boy twin latched first but he is not able to sustain a latch without NS, so tried a #20 and he grasped areola deeper and sucking was stronger and more frequent with a few swallows.  LC attempting to assist both babies with assistance of FOB.  LC demonstrated application of NS and use of curved-tip syringe for adding ebm or alimentum to NS to ensure latch if baby fussy.  FOB fed boy twin 6 ml's of alimentum  With syringe and 5 Fr feeding tube (finger feeding) while LC assists Patricia twin to re-latch with NS and drops of alimentum into NS.  Patricia took 3 ml's of alimentum via curved-tip syringe during breastfeeding and topped off after she slipped off breast.  Report of feeding given to Sheralyn Boatmanoni, Charity fundraiserN and parents were given plan options, with reminder to pre-pump a few minutes, attempt breastfeeding, use ebm or formula to entice to latch and/or supplement, with feedings q3h.  Mom exhausted so LC recommends not puming at this feeding, since both babies breastfed and received supplement.   Maternal Data    Feeding Feeding Type: Breast Fed Length of feed: 15 min  LATCH Score/Interventions Latch: Grasps breast easily, tongue down, lips flanged, rhythmical sucking. (sometimes slips off and needs to re-latch; able to latch without NS, but w/NS when fussy) Intervention(s): Adjust position;Assist with latch;Breast compression  Audible Swallowing: Spontaneous and intermittent Intervention(s): Skin to skin Intervention(s): Skin to skin;Alternate breast massage  Type of Nipple: Everted at rest and after stimulation Intervention(s): Double electric pump  Comfort (Breast/Nipple): Soft /  non-tender     Hold (Positioning): Full assist, staff holds infant at breast Intervention(s): Breastfeeding basics reviewed;Support Pillows;Position options;Skin to skin (mom able to nurse babies simultaneously)  LATCH Score: 8 (Patricia twin's feeding assessment at 20000 - boy twin had brief latching and sucking with NS, then was finger-fed)  Lactation Tools Discussed/Used Tools: Nipple Dorris CarnesShields;Other (comment) (curved-tip syringe) Nipple shield size: 20 Breast pump type: Double-Electric Breast Pump   Consult Status Consult Status: Follow-up Date: 03/16/15 Follow-up type: In-patient    Warrick ParisianBryant, Anvay Tennis [facility] 03/15/2015, 8:39 PM

## 2015-03-15 NOTE — Transfer of Care (Signed)
Immediate Anesthesia Transfer of Care Note  Patient: Patricia Cooley  Procedure(s) Performed: Procedure(s): CESAREAN SECTION MULTI-GESTATIONAL  Patient Location: PACU  Anesthesia Type:Epidural  Level of Consciousness: awake, alert  and oriented  Airway & Oxygen Therapy: Patient Spontanous Breathing  Post-op Assessment: Report given to RN and Post -op Vital signs reviewed and stable  Post vital signs: Reviewed and stable  Last Vitals:  Filed Vitals:   03/15/15 0558  BP: 140/74  Pulse: 99  Temp:   Resp: 18    Complications: No apparent anesthesia complications

## 2015-03-15 NOTE — Op Note (Signed)
Patricia AlexandriaAngela C Werntz PROCEDURE DATE: 03/14/2015 - 03/15/2015  PREOPERATIVE DIAGNOSIS: Intrauterine pregnancy at  586w4d weeks gestation, di/di twins, arrest of descent  POSTOPERATIVE DIAGNOSIS: The same  PROCEDURE:  Primary Low Transverse Cesarean Section  SURGEON:  Dr. Mitchel HonourMegan Willodean Leven  INDICATIONS: Patricia Cooley is a 39 y.o. G1P0 at 216w4d scheduled for cesarean section secondary to arrest of descent, di/di twins.  The risks of cesarean section discussed with the patient included but were not limited to: bleeding which may require transfusion or reoperation; infection which may require antibiotics; injury to bowel, bladder, ureters or other surrounding organs; injury to the fetus; need for additional procedures including hysterectomy in the event of a life-threatening hemorrhage; placental abnormalities wth subsequent pregnancies, incisional problems, thromboembolic phenomenon and other postoperative/anesthesia complications. The patient concurred with the proposed plan, giving informed written consent for the procedure.    FINDINGS:  Twin A-viable female infant in cephalic presentation (OP and asynclitic), APGARs 9,9:  Weight pending. Twin B-viable female infant in cephalic presentation, APGARs 9,9: weight pending.  Clear amniotic fluid.  Intact placenta, three vessel cord.  Grossly normal uterus, ovaries and fallopian tubes. .   ANESTHESIA:    Epidural ESTIMATED BLOOD LOSS: 1200 ml SPECIMENS: Placenta sent to L&D COMPLICATIONS: None immediate  PROCEDURE IN DETAIL:  The patient received intravenous antibiotics and had sequential compression devices applied to her lower extremities while in the preoperative area.  She was then taken to the operating room where epidural anesthesia was dosed up to surgical level and was found to be adequate. She was then placed in a dorsal supine position with a leftward tilt, and prepped and draped in a sterile manner.  A foley catheter was placed into her bladder and  attached to constant gravity.  After an adequate timeout was performed, a Pfannenstiel skin incision was made with scalpel and carried through to the underlying layer of fascia. The fascia was incised in the midline and this incision was extended bilaterally using the Mayo scissors. Kocher clamps were applied to the superior aspect of the fascial incision and the underlying rectus muscles were dissected off bluntly. A similar process was carried out on the inferior aspect of the facial incision. The rectus muscles were separated in the midline bluntly and the peritoneum was entered bluntly.   A bladder flap was created sharply and developed bluntly.  The bladder was protected behind the bladder blade.  A transverse hysterotomy was made with a scalpel and extended bilaterally bluntly. The bladder blade was then removed. Twin A was successfully delivered, and cord was clamped and cut and infant was handed over to awaiting neonatology team. Twin B was successfully delivered using a single Kiwi vacuum pull.  The cord was clamped, cut and the infant handed over to NICU.  Uterine massage was then administered and the placenta delivered intact with three-vessel cord. The uterus was cleared of clot and debris.  Uterine atony was noted and massage, additional pitocin and methergine x 1 given with improvement in tone.  The hysterotomy was closed with 0 chromic.  A second imbricating suture of 0-chromic was used to reinforce the incision and aid in hemostasis.  The peritoneum and rectus muscles were noted to be hemostatic and were reapproximated using 2-0 monocryl.  The fascia was closed with 0-Vicryl in a running fashion with good restoration of anatomy.  The subcutaneus tissue was copiously irrigated.  A running plain gut suture of the subcutaneous tissue was performed.  The skin was closed with 4-0 vicryl  in a subcuticular fashion.  Pt tolerated the procedure will.  All counts were correct x2.  Pt went to the recovery  room in stable condition.

## 2015-03-15 NOTE — Addendum Note (Signed)
Addendum  created 03/15/15 1402 by Renford DillsJanet L Jleigh Striplin, CRNA   Modules edited: Notes Section   Notes Section:  File: [id number]

## 2015-03-15 NOTE — Progress Notes (Signed)
Patient called complete at 0130 and 0-+1 station.  After 1.5 hours of laboring down with pitocin, no descent.  Fetus A position felt to be asynclitic.  The patient pushed effectively for 1 hour and no descent.  Attempts at correcting asynclitism failed.  Patient counseled for primary C/S including risk of bleeding, infection, scarring, and damage to surrounding structures.  Patient understands implications in future pregnancies including 1% risk of uterine rupture.  All questions were answered and the patient wishes to proceed.   Mitchel HonourMegan Delson Dulworth, DO

## 2015-03-15 NOTE — Anesthesia Postprocedure Evaluation (Signed)
Anesthesia Post Note  Patient: Patricia Cooley  Procedure(s) Performed: Procedure(s): CESAREAN SECTION MULTI-GESTATIONAL  Anesthesia type: Spinal  Patient location: PACU  Post pain: Pain level controlled  Post assessment: Post-op Vital signs reviewed  Last Vitals:  Filed Vitals:   03/15/15 0900  BP: 120/73  Pulse: 84  Temp: 36.8 C  Resp: 16    Post vital signs: Reviewed  Level of consciousness: awake  Complications: No apparent anesthesia complications

## 2015-03-15 NOTE — Anesthesia Postprocedure Evaluation (Signed)
  Anesthesia Post-op Note  Patient: Patricia Cooley  Procedure(s) Performed: Procedure(s): CESAREAN SECTION MULTI-GESTATIONAL  Patient Location: Mother/Baby  Anesthesia Type:Epidural  Level of Consciousness: awake  Airway and Oxygen Therapy: Patient Spontanous Breathing  Post-op Pain: mild  Post-op Assessment: Patient's Cardiovascular Status Stable and Respiratory Function Stable  Post-op Vital Signs: stable  Last Vitals:  Filed Vitals:   03/15/15 1224  BP: 138/75  Pulse: 84  Temp: 36.4 C  Resp: 18    Complications: No apparent anesthesia complications

## 2015-03-16 LAB — CBC
HEMATOCRIT: 19.8 % — AB (ref 36.0–46.0)
Hemoglobin: 6.7 g/dL — CL (ref 12.0–15.0)
MCH: 29.8 pg (ref 26.0–34.0)
MCHC: 33.8 g/dL (ref 30.0–36.0)
MCV: 88 fL (ref 78.0–100.0)
Platelets: 162 10*3/uL (ref 150–400)
RBC: 2.25 MIL/uL — ABNORMAL LOW (ref 3.87–5.11)
RDW: 14.5 % (ref 11.5–15.5)
WBC: 14.8 10*3/uL — AB (ref 4.0–10.5)

## 2015-03-16 NOTE — Progress Notes (Signed)
Subjective: Postpartum Day 1: Cesarean Delivery Patient reports no problems voiding.    Objective: Vital signs in last 24 hours: Temp:  [97.5 F (36.4 C)-98.8 F (37.1 C)] 98.1 F (36.7 C) (04/02 0549) Pulse Rate:  [75-115] 98 (04/02 0549) Resp:  [13-19] 19 (04/02 0549) BP: (104-138)/(62-84) 137/64 mmHg (04/02 0549) SpO2:  [96 %-99 %] 99 % (04/02 0549)  Physical Exam:  General: alert Lochia: appropriate Uterine Fundus: firm Incision: healing well DVT Evaluation: No evidence of DVT seen on physical exam.   Recent Labs  03/14/15 1700 03/16/15 0712  HGB 10.6* 6.7*  HCT 32.1* 19.8*    Assessment/Plan: Status post Cesarean section. Doing well postoperatively.  Continue current care, check CBC am.  Patricia Cooley,Patricia Cooley 03/16/2015, 8:09 AM

## 2015-03-16 NOTE — Progress Notes (Signed)
Mom stated if she is sleeping when assessment is due then she does not want to be disturbed preceptor notified.

## 2015-03-16 NOTE — Progress Notes (Signed)
During shift safety rounds completed with CNA Julianne HandlerAmanda Cameron.  Care during shift witnessed by Julianne HandlerAmanda Cameron CNA.

## 2015-03-16 NOTE — Progress Notes (Signed)
Dr Marcelle OverlieHolland here on floor notified patients hgb 6.7

## 2015-03-17 LAB — CBC
HCT: 19.7 % — ABNORMAL LOW (ref 36.0–46.0)
Hemoglobin: 6.5 g/dL — CL (ref 12.0–15.0)
MCH: 29.7 pg (ref 26.0–34.0)
MCHC: 33.5 g/dL (ref 30.0–36.0)
MCV: 88.7 fL (ref 78.0–100.0)
Platelets: 195 10*3/uL (ref 150–400)
RBC: 2.22 MIL/uL — AB (ref 3.87–5.11)
RDW: 14.8 % (ref 11.5–15.5)
WBC: 14.8 10*3/uL — ABNORMAL HIGH (ref 4.0–10.5)

## 2015-03-17 MED ORDER — FERROUS SULFATE 325 (65 FE) MG PO TABS
325.0000 mg | ORAL_TABLET | Freq: Two times a day (BID) | ORAL | Status: DC
  Administered 2015-03-17 – 2015-03-18 (×2): 325 mg via ORAL
  Filled 2015-03-17 (×2): qty 1

## 2015-03-17 NOTE — Lactation Note (Addendum)
This note was copied from the chart of BoyB Ottis Stainngela Kydd. Lactation Consultation Note  Patient Name: Festus HoltsBoyB Tama Piltz ZOXWR'UToday's Date: 03/17/2015 Reason for consult: Follow-up assessment;Multiple gestation;Infant < 6lbs;Late preterm infant Mom has been bottle feeding. [facility] feed. She is considering pumping to provide breast milk as well, but has not pumped since yesterday. Mom reports not receiving any EBM with pumping and LC explained this was normal and that it would take consistent pumping every 3 hours for 15 minutes to encourage her milk production. Mom does have Medela DEBP coming from her insurance company. Discussed rental programs if needed. Mom denies any discomfort with pumping. Encouraged to increase supplements to a minimum of 20 ml each feeding per LPT guidelines. Mom reports she was advised to try to get babies to take 30 ml with each feeding.  Encouraged Mom to call for questions/concerns.   Maternal Data    Feeding Feeding Type: Bottle Fed - Formula  LATCH Score/Interventions                      Lactation Tools Discussed/Used Tools: Pump Breast pump type: Double-Electric Breast Pump   Consult Status Consult Status: Complete Date: 03/17/15 Follow-up type: In-patient    Alfred LevinsGranger, Malicia Blasdel Ann 03/17/2015, 3:49 PM

## 2015-03-17 NOTE — Progress Notes (Signed)
CRITICAL VALUE ALERT  Critical value received:  6.5 Hgb  Date of notification:  03/17/15  Time of notification:  0648  Critical value read back:Yes.    Nurse who received alert:  Burman RiisJessica Stephenson Cichy, RN  MD notified (1st page):  Dr. Marcelle OverlieHolland  Time of first page:  0650  MD notified (2nd page):  Time of second page:  Responding MD:  Dr. Marcelle OverlieHolland  Time MD responded:  [phone]

## 2015-03-17 NOTE — Progress Notes (Signed)
Subjective: Postpartum Day 2: Cesarean Delivery Patient reports tolerating PO.    Objective: Vital signs in last 24 hours: Temp:  [98.4 F (36.9 C)-98.6 F (37 C)] 98.6 F (37 C) (04/03 0626) Pulse Rate:  [92-105] 92 (04/03 0626) Resp:  [18-20] 20 (04/03 0626) BP: (125-134)/(69-78) 134/78 mmHg (04/03 16100626)  Physical Exam:  General: alert Lochia: appropriate Uterine Fundus: firm Incision: healing well DVT Evaluation: No evidence of DVT seen on physical exam.   Recent Labs  03/16/15 0712 03/17/15 0610  HGB 6.7* 6.5*  HCT 19.8* 19.7*    Assessment/Plan: Status post Cesarean section. Doing well postoperatively.  Continue current care, Fe, cbc am.  Meriel PicaHOLLAND,Mysha Peeler M 03/17/2015, 9:07 AM

## 2015-03-18 ENCOUNTER — Encounter (HOSPITAL_COMMUNITY): Payer: Self-pay | Admitting: Obstetrics & Gynecology

## 2015-03-18 LAB — CBC
HEMATOCRIT: 19.3 % — AB (ref 36.0–46.0)
Hemoglobin: 6.4 g/dL — CL (ref 12.0–15.0)
MCH: 29.8 pg (ref 26.0–34.0)
MCHC: 33.2 g/dL (ref 30.0–36.0)
MCV: 89.8 fL (ref 78.0–100.0)
Platelets: 249 10*3/uL (ref 150–400)
RBC: 2.15 MIL/uL — ABNORMAL LOW (ref 3.87–5.11)
RDW: 15.1 % (ref 11.5–15.5)
WBC: 10.4 10*3/uL (ref 4.0–10.5)

## 2015-03-18 MED ORDER — FERROUS SULFATE 325 (65 FE) MG PO TABS: 325.0000 mg | ORAL_TABLET | Freq: Two times a day (BID) | ORAL | Status: AC

## 2015-03-18 MED ORDER — IBUPROFEN 600 MG PO TABS: 600.0000 mg | ORAL_TABLET | Freq: Four times a day (QID) | ORAL | Status: AC

## 2015-03-18 NOTE — Discharge Summary (Signed)
Obstetric Discharge Summary Reason for Admission: rupture of membranes Prenatal Procedures: ultrasound Intrapartum Procedures: cesarean: low cervical, transverse Postpartum Procedures: none Complications-Operative and Postpartum: none HEMOGLOBIN  Date Value Ref Range Status  03/18/2015 6.4* 12.0 - 15.0 g/dL Final    Comment:    REPEATED TO VERIFY CRITICAL RESULT CALLED TO, READ BACK BY AND VERIFIED WITH: J MCNABB @0620  03/18/15 BY S GEZAHEGN    HCT  Date Value Ref Range Status  03/18/2015 19.3* 36.0 - 46.0 % Final    Physical Exam:  General: alert and cooperative Lochia: appropriate Uterine Fundus: firm Incision: healing well DVT Evaluation: No evidence of DVT seen on physical exam. Negative Homan's sign. No cords or calf tenderness. Calf/Ankle edema is present.  Discharge Diagnoses: s/p cesarean delivery of twins at 4936 weeks  Discharge Information: Date: 03/18/2015 Activity: pelvic rest Diet: routine Medications: PNV, Ibuprofen and Iron Condition: stable Instructions: refer to practice specific booklet Discharge to: home   Newborn Data:   Curt BearsCraven, Girl Toni [[id number]][[id number]]  Live born female  Birth Weight: 5 lb 3.3 oz (2360 g) APGAR: 9, 9   Festus HoltsCraven, BoyB Yamile [[id number]][[id number]]  Live born female  Birth Weight: 5 lb 7.7 oz (2486 g) APGAR: 9, 9  Home with mother.  CURTIS,CAROL G 03/18/2015, 8:22 AM

## 2015-03-18 NOTE — Lactation Note (Signed)
This note was copied from the chart of Patricia Cooley. Lactation Consultation Note  Mother states she leaning toward not breastfeeding.  Reviewed advantage of breastfeeding particularly for late preterm infants. Provided information regarding engorgement and cabbage leaves if she chooses not to breastfeed. Offered to rent mother pump but she states her DEBP will arrive tomorrow and she has hand pump. Encouraged her to call if she needs further assistance.   Patient Name: Patricia Cooley Today's Date: 03/18/2015 Reason for consult: Follow-up assessment   Maternal Data    Feeding Feeding Type: Bottle Fed - Formula Nipple Type: Regular  LATCH Score/Interventions                      Lactation Tools Discussed/Used     Consult Status Consult Status: Complete    Roberta Angell Boschen 03/18/2015, 10:47 AM    

## 2015-03-18 NOTE — Progress Notes (Signed)
Received call from lab.  Pt's Hgb down to 6.4 on 03/18/15 from 6.5 on 03/17/15.  No change in patient from previous day's notification, patient remains asymptomatic.

## 2015-03-19 ENCOUNTER — Other Ambulatory Visit: Payer: BLUE CROSS/BLUE SHIELD

## 2015-03-27 ENCOUNTER — Inpatient Hospital Stay (HOSPITAL_COMMUNITY): Admission: RE | Admit: 2015-03-27 | Payer: BLUE CROSS/BLUE SHIELD | Source: Ambulatory Visit

## 2015-04-24 ENCOUNTER — Other Ambulatory Visit: Payer: Self-pay | Admitting: Obstetrics and Gynecology

## 2015-04-25 LAB — CYTOLOGY - PAP

## 2015-05-23 IMAGING — US US MFM OB TRANSVAGINAL
1 series · 13 of 22 positions shown · non-contrast
Comparison: none

[Series 1: us mfm ob transvaginal · 22 acquisitions, 13 frames shown]
[im 1/22]
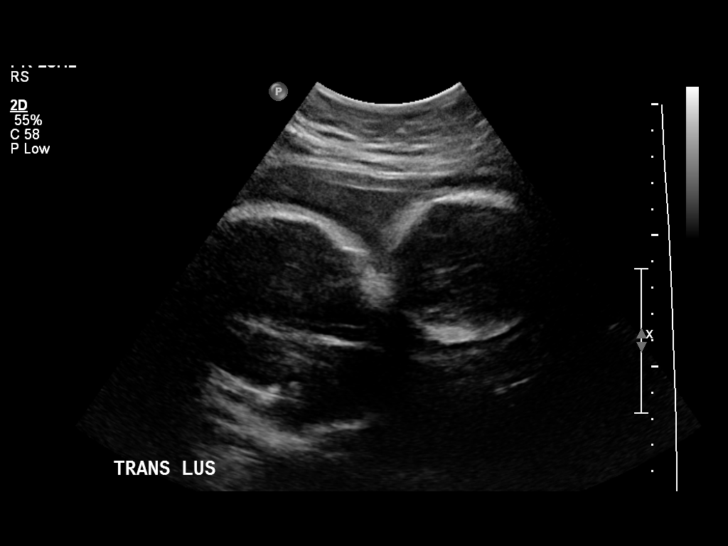
[im 3/22]
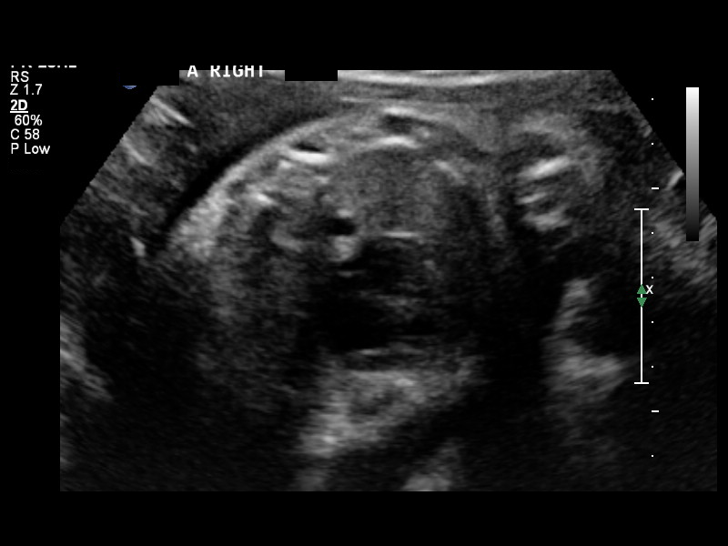
[im 5/22]
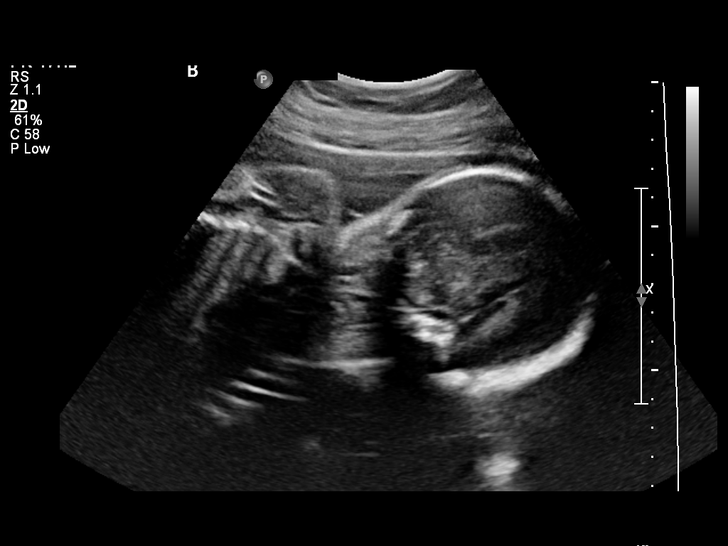
[im 6/22]
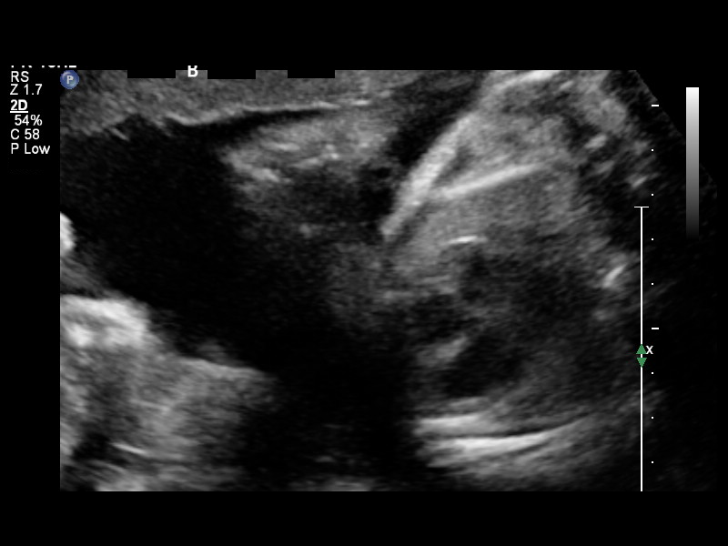
[im 8/22]
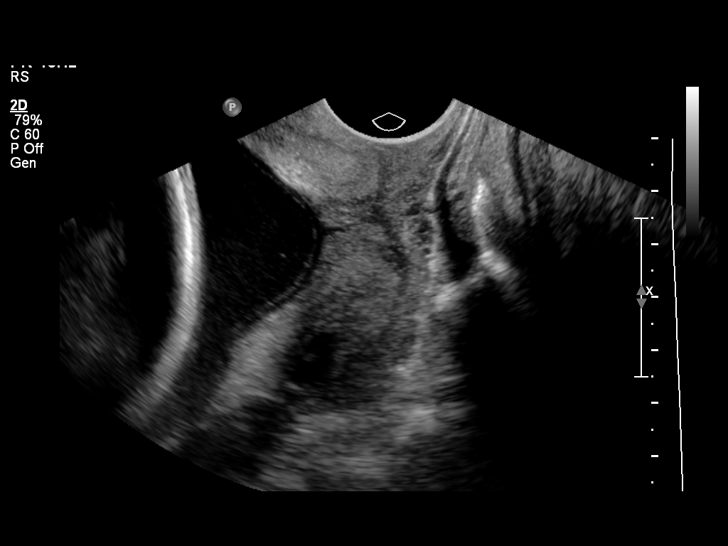
[im 10/22]
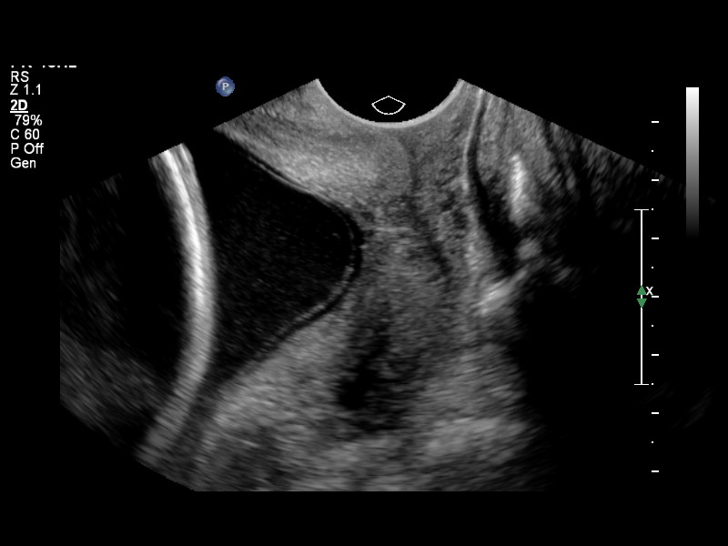
[im 12/22]
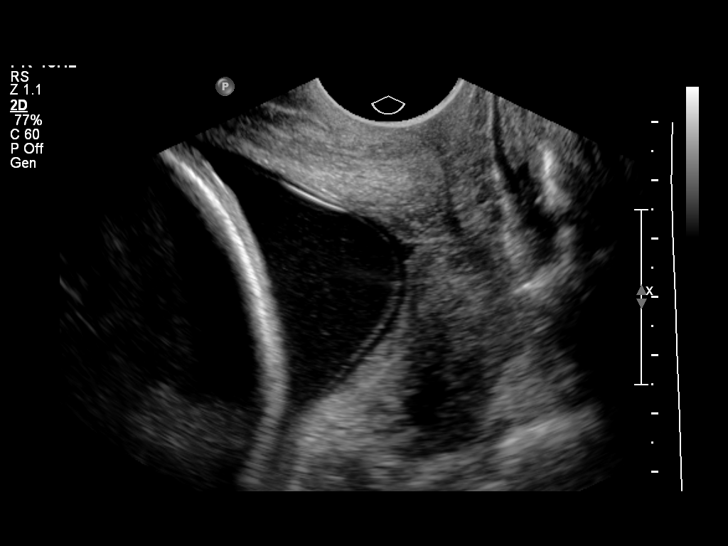
[im 13/22]
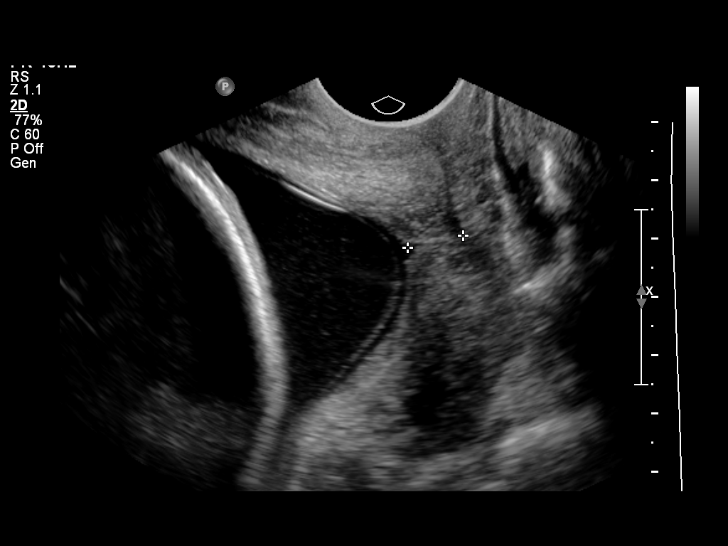
[im 15/22]
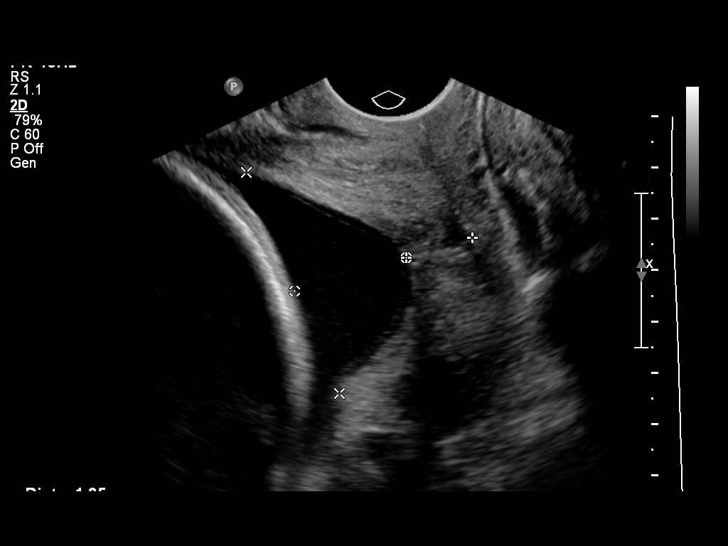
[im 17/22]
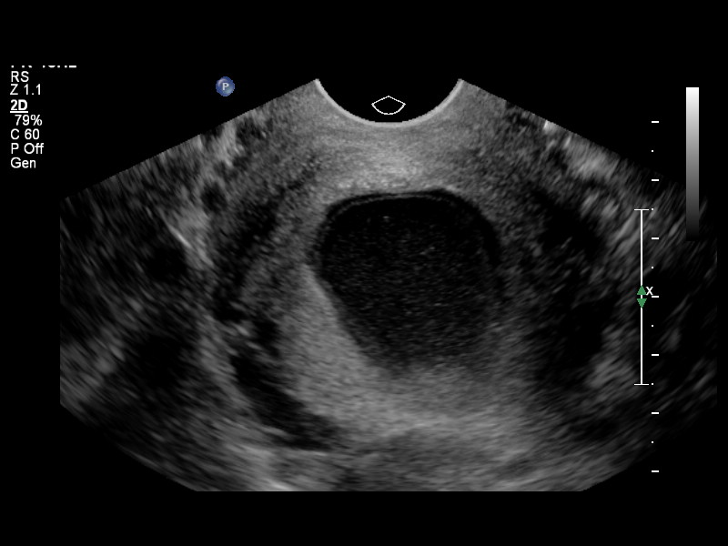
[im 18/22]
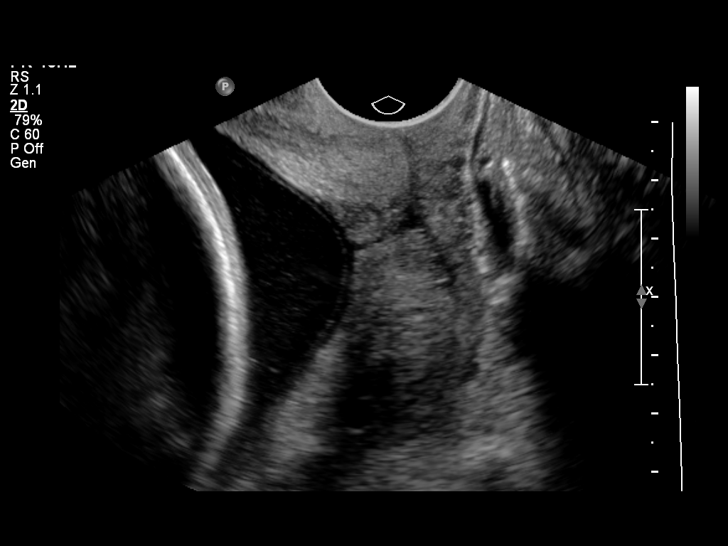
[im 20/22]
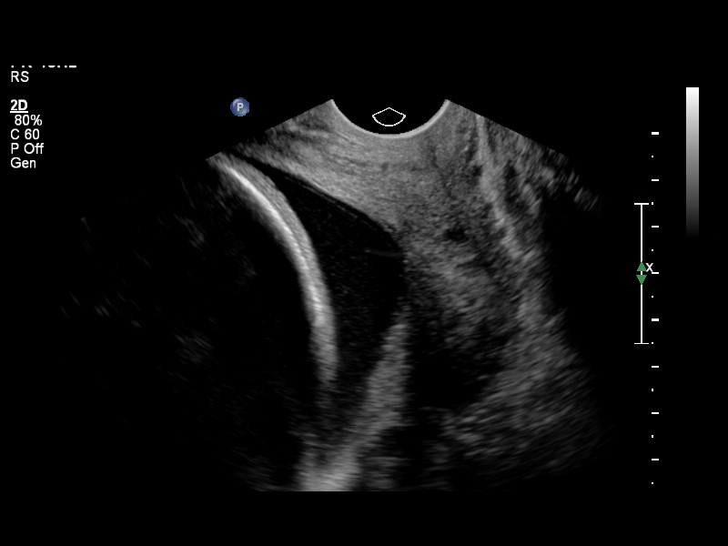
[im 22/22]
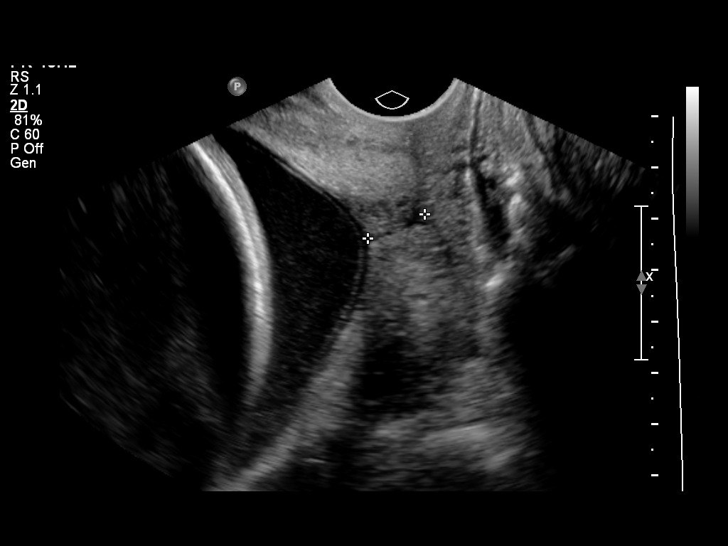

[13 of 22 positions shown; findings below may reference images not displayed]

OBSTETRICS REPORT
                      (Signed Final 01/30/2015 [DATE])

Service(s) Provided

 US MFM OB TRANSVAGINAL                                76817.2
Indications

 Cervical shortening, third trimester
 30 weeks gestation of pregnancy
 Twin pregnancy, di/di, third trimester
Fetal Evaluation (Fetus A)

 Num Of Fetuses:    2
 Fetal Heart Rate:  163                          bpm
 Cardiac Activity:  Observed
 Fetal Lie:         Maternal right side
 Presentation:      Cephalic
Gestational Age (Fetus A)

 Clinical EDD:  30w 2d                                        EDD:   04/08/15
 Best:          30w 2d     Det. By:  Clinical EDD             EDD:   04/08/15

Fetal Evaluation (Fetus B)

 Num Of Fetuses:    2
 Fetal Heart Rate:  152                          bpm
 Cardiac Activity:  Observed
 Fetal Lie:         Maternal left side
 Presentation:      Cephalic
Gestational Age (Fetus B)

 Clinical EDD:  30w 2d                                        EDD:   04/08/15
 Best:          30w 2d     Det. By:  Clinical EDD             EDD:   04/08/15
Cervix Uterus Adnexa

 Cervical Length:    1.2      cm

 Cervix:       Appears funnelled, see comments.

 Comment:    Cervix measured transvaginally, measuring b/t 1.2-1.4cm
             with 2.3cm of funnelling
Impression

 DC/DA twin gestation at 30w 2d
 Limited ultrasound performed for cervical length only
 TVUS - cervical length 1.2 cm with V-shaped funneling
Recommendations

 Would offer a course of Bagaloo in not previously
 completed
 Continue serial growth studies every 4 weeks due to twin
 gestation

 questions or concerns.

## 2015-05-25 IMAGING — DX DG CHEST 2V
2 series · 2 of 2 positions shown · non-contrast
Comparison: None.

CLINICAL DATA: Shortness of breath.  Thirty weeks pregnant.

EXAM:
CHEST  2 VIEW

[chest pa]
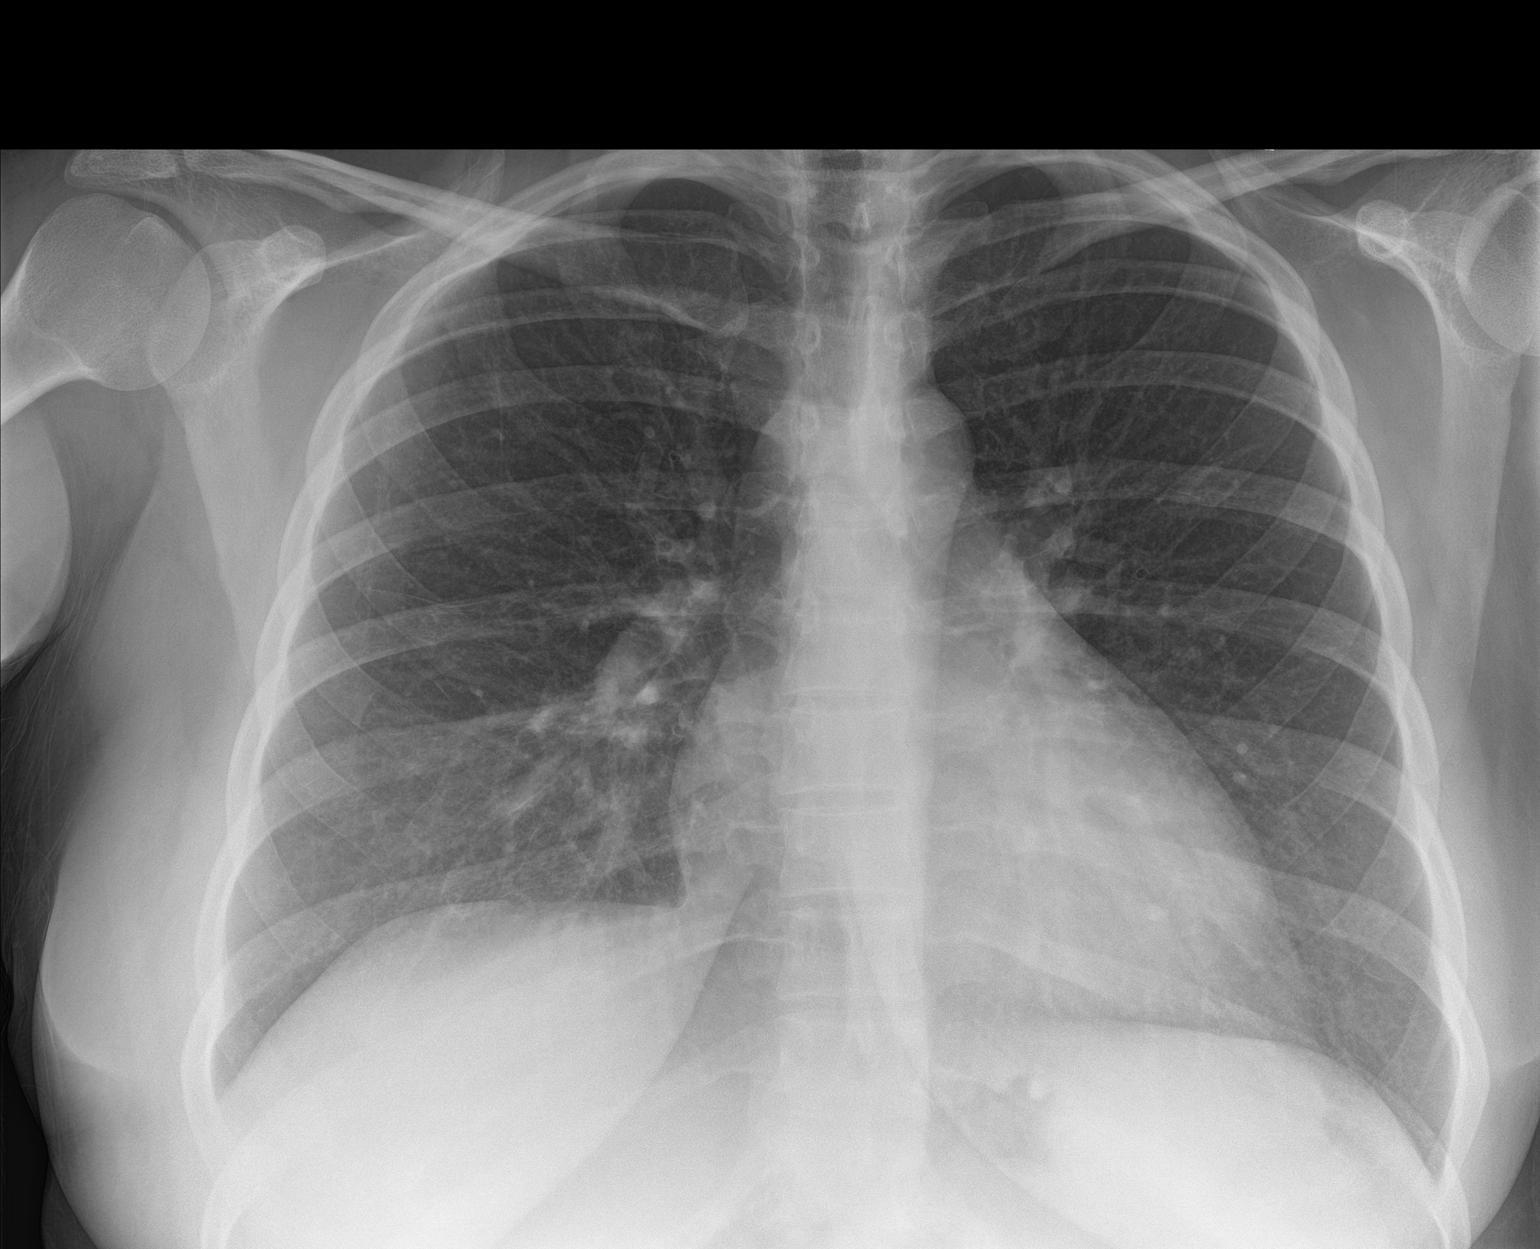

[chest lat]
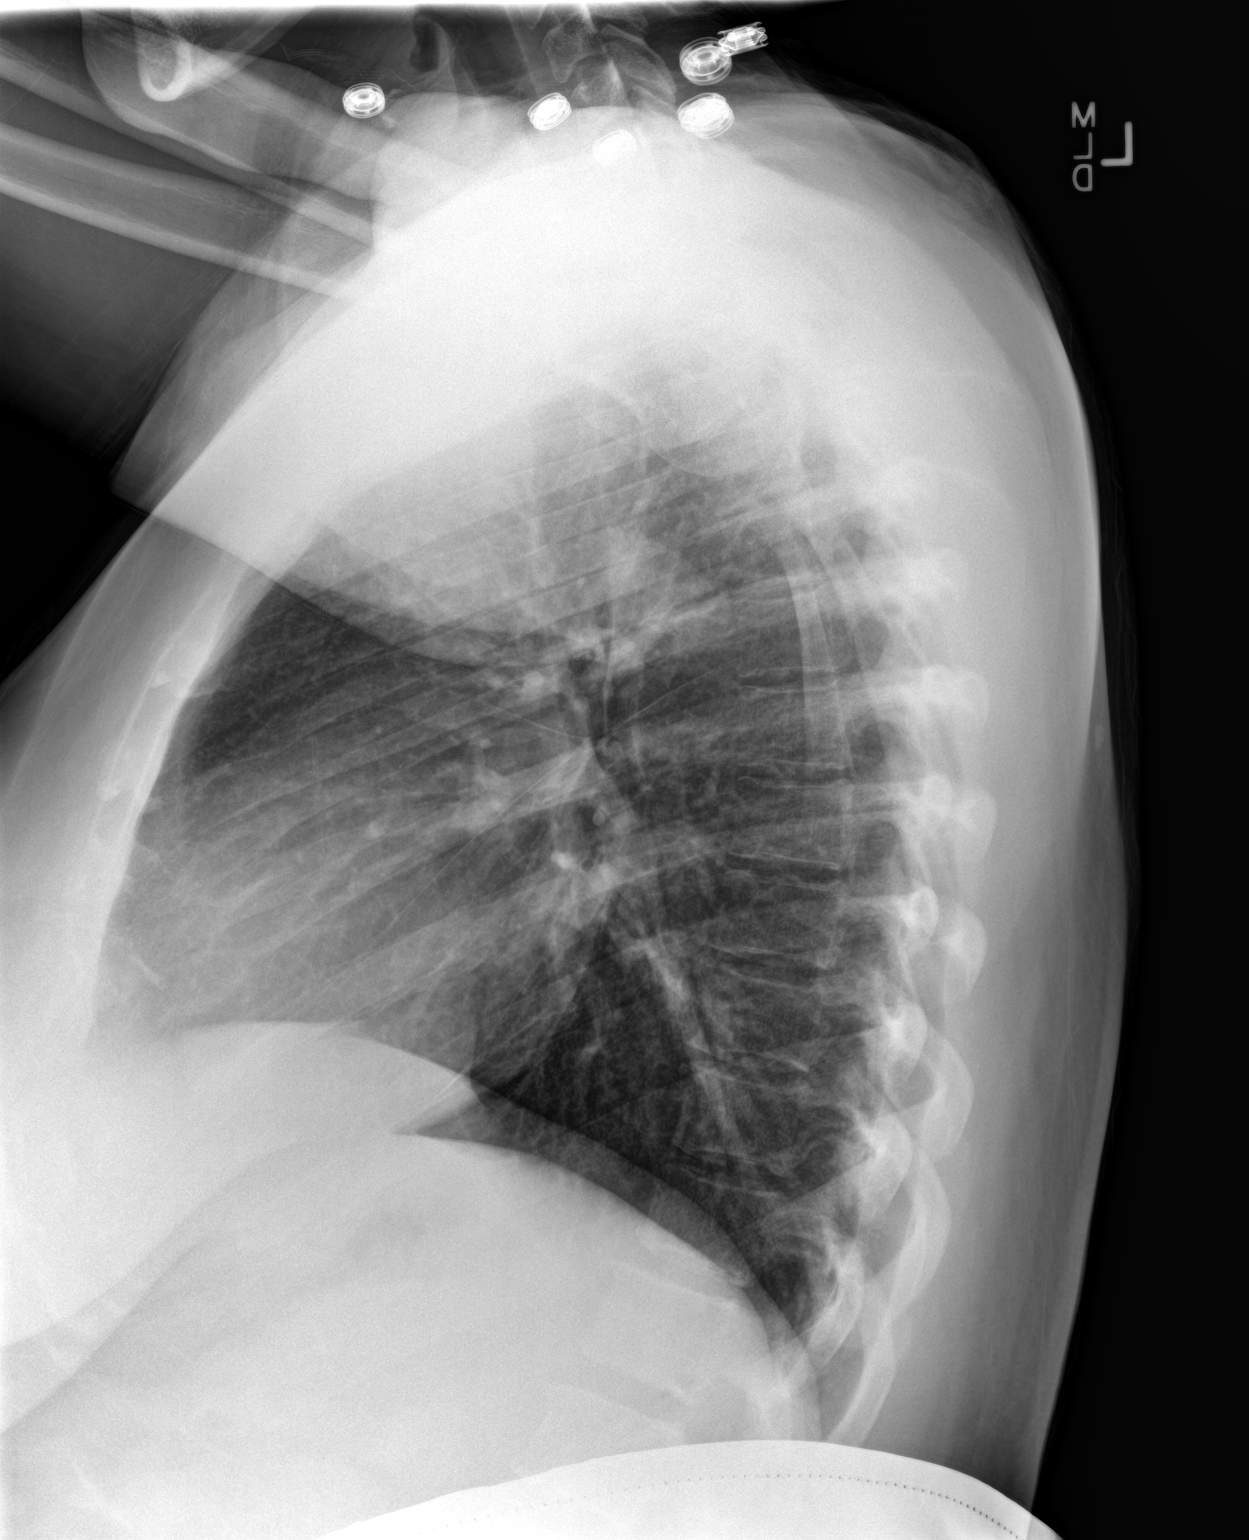

[2 of 2 positions shown; findings below may reference images not displayed]

FINDINGS: The heart size and mediastinal contours are within normal limits.
Both lungs are clear. No evidence of pleural effusion. No mass or
lymphadenopathy identified. The visualized skeletal structures are
unremarkable.
IMPRESSION: No active cardiopulmonary disease.

## 2015-07-08 ENCOUNTER — Encounter: Payer: Self-pay | Admitting: Podiatry

## 2015-07-08 ENCOUNTER — Ambulatory Visit (INDEPENDENT_AMBULATORY_CARE_PROVIDER_SITE_OTHER): Payer: BLUE CROSS/BLUE SHIELD | Admitting: Podiatry

## 2015-07-08 VITALS — BP 113/68 | HR 79 | Resp 18

## 2015-07-08 DIAGNOSIS — L600 Ingrowing nail: Secondary | ICD-10-CM | POA: Diagnosis not present

## 2015-07-08 NOTE — Progress Notes (Signed)
   Subjective:    Patient ID: Patricia Cooley, female    DOB: 07/01/1976, 39 y.o.   MRN: [id number]  HPI MY RIGHT BIG TOENAIL HAS LIFTED UP AND I HAVE TO TRIM ON IT SO IT WILL NOT HURT WHEN IT GETS LONG AND WAS SORE AND TENDER AND THERE IS NO INJURY TO IT This patient presents to office with painful big toe after certain activities.  She says pain occurs along the inside border big toe right foot.  She says about 6 months ago she injured her nail and the resultant nail after the injury reveals marked incurvation along inside border right big toe.   Review of Systems  All other systems reviewed and are negative.      Objective:   Physical Exam GENERAL APPEARANCE: Alert, conversant. Appropriately groomed. No acute distress.  VASCULAR: Pedal pulses palpable at 2/4 DP and PT bilateral.  Capillary refill time is immediate to all digits,  Proximal to distal cooling it warm to warm.  Digital hair growth is present bilateral  NEUROLOGIC: sensation is intact epicritically and protectively to 5.07 monofilament at 5/5 sites bilateral.  Light touch is intact bilateral, vibratory sensation intact bilateral, achilles tendon reflex is intact bilateral.  MUSCULOSKELETAL: acceptable muscle strength, tone and stability bilateral.  Intrinsic muscluature intact bilateral.  Rectus appearance of foot and digits noted bilateral.   DERMATOLOGIC: skin color, texture, and turgor are within normal limits.  No preulcerative lesions or ulcers  are seen, no interdigital maceration noted.  No open lesions present.  . No drainage noted. NAILS  Marked incurvation medial border right big toe.  No signs of redness or swelling.        Assessment & Plan:  Ingrown Nail right hallux.     IE  Discussed condition with patient.  To consider permanent correction in future.

## 2023-10-20 ENCOUNTER — Other Ambulatory Visit: Payer: Self-pay | Admitting: Obstetrics and Gynecology

## 2023-10-20 DIAGNOSIS — E049 Nontoxic goiter, unspecified: Secondary | ICD-10-CM

## 2023-10-26 ENCOUNTER — Ambulatory Visit
Admission: RE | Admit: 2023-10-26 | Discharge: 2023-10-26 | Disposition: A | Discharge status: Home / Self Care | Payer: BC Managed Care – PPO | Source: Ambulatory Visit | Attending: Obstetrics and Gynecology | Admitting: Obstetrics and Gynecology

## 2023-10-26 DIAGNOSIS — E049 Nontoxic goiter, unspecified: Secondary | ICD-10-CM
